# Patient Record
Sex: Female | Born: 1995 | Race: White | Hispanic: No | Marital: Single | Smoking: Never smoker
Health system: Southern US, Community
[De-identification: ages and names within clinical notes are randomized; demographics above are authoritative.]

---

## 2013-11-02 DIAGNOSIS — L03019 Cellulitis of unspecified finger: Secondary | ICD-10-CM | POA: Insufficient documentation

## 2013-11-03 ENCOUNTER — Encounter (HOSPITAL_COMMUNITY): Payer: Self-pay | Admitting: Emergency Medicine

## 2013-11-03 ENCOUNTER — Emergency Department (HOSPITAL_COMMUNITY)
Admission: EM | Admit: 2013-11-03 | Discharge: 2013-11-03 | Disposition: A | Discharge status: Home / Self Care | Payer: Self-pay | Attending: Emergency Medicine | Admitting: Emergency Medicine

## 2013-11-03 DIAGNOSIS — IMO0002 Reserved for concepts with insufficient information to code with codable children: Secondary | ICD-10-CM

## 2013-11-03 MED ORDER — CEPHALEXIN 500 MG PO CAPS: 500.0000 mg | ORAL_CAPSULE | Freq: Three times a day (TID) | ORAL | Status: DC

## 2013-11-03 MED ORDER — HYDROCODONE-ACETAMINOPHEN 5-325 MG PO TABS: 1.0000 | ORAL_TABLET | Freq: Four times a day (QID) | ORAL | Status: DC | PRN

## 2013-11-03 NOTE — ED Provider Notes (Signed)
CSN: [id number]     Arrival date & time 11/02/13  2353 History   None    Chief Complaint  Patient presents with  . Hand Pain   HPI Comments: Patient presents for three days of right middle finger pain on her dominant hand.  She states that she noticed it starting to swell and become more painful to pressure two days ago.  She has tried ibuprofen with little relief.  She denies any history of injury or wound to the hand.  She denies biting her finger nails.      Patient is a 18 y.o. female presenting with hand pain. The history is provided by the patient. No language interpreter was used.  Hand Pain This is a new problem. The current episode started in the past 7 days. The problem occurs constantly. The problem has been gradually worsening. Pertinent negatives include no anorexia, arthralgias, chest pain, chills, diaphoresis, fatigue, fever, headaches, joint swelling, myalgias, numbness, rash or vomiting.    History reviewed. No pertinent past medical history. History reviewed. No pertinent past surgical history. No family history on file. History  Substance Use Topics  . Smoking status: Never Smoker   . Smokeless tobacco: Not on file  . Alcohol Use: Not on file   OB History   Grav Para Term Preterm Abortions TAB SAB Ect Mult Living                 Review of Systems  Constitutional: Negative for fever, chills, diaphoresis and fatigue.  Respiratory: Negative for chest tightness and shortness of breath.   Cardiovascular: Negative for chest pain.  Gastrointestinal: Negative for vomiting and anorexia.  Musculoskeletal: Negative for arthralgias, joint swelling and myalgias.  Skin: Negative for color change, rash and wound.  Neurological: Negative for numbness and headaches.  All other systems reviewed and are negative.     Allergies  Review of patient's allergies indicates no known allergies.  Home Medications   Prior to Admission medications   Not on File   BP 145/78  Pulse  102  Temp(Src) 98.3 F (36.8 C) (Oral)  Resp 18  Ht 5\' 8"  (1.727 m)  Wt 160 lb (72.576 kg)  BMI 24.33 kg/m2  SpO2 98%  LMP 10/14/2013 Physical Exam  Nursing note and vitals reviewed. Constitutional: She is oriented to person, place, and time. She appears well-developed and well-nourished. No distress.  HENT:  Head: Normocephalic and atraumatic.  Eyes: Conjunctivae are normal. No scleral icterus.  Neck: Normal range of motion. Neck supple.  Cardiovascular: Normal rate, regular rhythm and intact distal pulses.  Exam reveals no gallop and no friction rub.   No murmur heard. Pulmonary/Chest: Effort normal and breath sounds normal. No respiratory distress. She has no wheezes. She has no rales. She exhibits no tenderness.  Musculoskeletal: Normal range of motion.  Noted edema to the right middle finger on the medial side of the cuticular nail fold and the pad of the finger.  Finger pad and medial cuticular fold tender to palpation with no noted induration, fluctuance, and erythema.  Full active ROM noted.  2+ right radial pulses.  Finger has sensation to light touch intact.    Neurological: She is alert and oriented to person, place, and time.  Skin: Skin is warm and dry. She is not diaphoretic.  Psychiatric: She has a normal mood and affect. Her behavior is normal. Judgment and thought content normal.    ED Course  INCISION AND DRAINAGE Date/Time: 11/03/2013 2:15 AM Performed by:  FORUCCI, Azriel Dancy A Authorized by: Madelyn FlavorsFORUCCI, Lexie Morini A Consent: Verbal consent obtained. Risks and benefits: risks, benefits and alternatives were discussed Consent given by: patient Patient understanding: patient states understanding of the procedure being performed Patient consent: the patient's understanding of the procedure matches consent given Procedure consent: procedure consent matches procedure scheduled Relevant documents: relevant documents present and verified Test results: test results available  and properly labeled Site marked: the operative site was marked Patient identity confirmed: verbally with patient Type: abscess Body area: upper extremity Anesthesia: digital block Local anesthetic: lidocaine 1% without epinephrine Anesthetic total: 4.5 ml Patient sedated: no Scalpel size: 10 Incision type: single straight Complexity: simple Drainage: purulent and  serosanguinous Drainage amount: scant Wound treatment: wound left open Packing material: none Patient tolerance: Patient tolerated the procedure well with no immediate complications.   (including critical care time) Labs Review Labs Reviewed - No data to display  Imaging Review No results found.   EKG Interpretation None      MDM   Final diagnoses:  Paronychia   Patient presents with early paronychia.  Have performed an I&D here today with some small purulent drainage.  Will place the patient on Keflex TID x7 days and will give patient 6 tablets of hydrocodone for pain as needed.  She is encouraged to use warm compresses as much as she can tolerate.  Patient was also given resource list and was told to either follow up with her new PCP from the list or to follow up here if she started to experience worsening of swelling, fever, or pain.  Patient understands and agrees with this plan.      Clydie Braunourtney A Forucci, PA-C 11/03/13 [phone]

## 2013-11-03 NOTE — Discharge Instructions (Signed)
Paronychia Paronychia is an inflammatory reaction involving the folds of the skin surrounding the fingernail. This is commonly caused by an infection in the skin around a nail. The most common cause of paronychia is frequent wetting of the hands (as seen with bartenders, food servers, nurses or others who wet their hands). This makes the skin around the fingernail susceptible to infection by bacteria (germs) or fungus. Other predisposing factors are:  Aggressive manicuring.  Nail biting.  Thumb sucking. The most common cause is a staphylococcal (a type of germ) infection, or a fungal (Candida) infection. When caused by a germ, it usually comes on suddenly with redness, swelling, pus and is often painful. It may get under the nail and form an abscess (collection of pus), or form an abscess around the nail. If the nail itself is infected with a fungus, the treatment is usually prolonged and may require oral medicine for up to one year. Your caregiver will determine the length of time treatment is required. The paronychia caused by bacteria (germs) may largely be avoided by not pulling on hangnails or picking at cuticles. When the infection occurs at the tips of the finger it is called felon. When the cause of paronychia is from the herpes simplex virus (HSV) it is called herpetic whitlow. TREATMENT  When an abscess is present treatment is often incision and drainage. This means that the abscess must be cut open so the pus can get out. When this is done, the following home care instructions should be followed. HOME CARE INSTRUCTIONS   It is important to keep the affected fingers very dry. Rubber or plastic gloves over cotton gloves should be used whenever the hand must be placed in water.  Keep wound clean, dry and dressed as suggested by your caregiver between warm soaks or warm compresses.  Soak in warm water for fifteen to twenty minutes three to four times per day for bacterial infections. Fungal  infections are very difficult to treat, so often require treatment for long periods of time.  For bacterial (germ) infections take antibiotics (medicine which kill germs) as directed and finish the prescription, even if the problem appears to be solved before the medicine is gone.  Only take over-the-counter or prescription medicines for pain, discomfort, or fever as directed by your caregiver. SEEK IMMEDIATE MEDICAL CARE IF:  You have redness, swelling, or increasing pain in the wound.  You notice pus coming from the wound.  You have a fever.  You notice a bad smell coming from the wound or dressing. Document Released: 11/12/2000 Document Revised: 08/11/2011 Document Reviewed: 07/14/2008 [facility] Patient Information 2014 [place], [place].   Emergency Department Resource Guide 1) Find a Doctor and Pay Out of Pocket Although you won't have to find out who is covered by your insurance plan, it is a good idea to ask around and get recommendations. You will then need to call the office and see if the doctor you have chosen will accept you as a new patient and what types of options they offer for patients who are self-pay. Some doctors offer discounts or will set up payment plans for their patients who do not have insurance, but you will need to ask so you aren't surprised when you get to your appointment.  2) Contact Your Local Health Department Not all health departments have doctors that can see patients for sick visits, but many do, so it is worth a call to see if yours does. If you don't know where your  local health department is, you can check in your phone book. The CDC also has a tool to help you locate your state's health department, and many state websites also have listings of all of their local health departments.  3) Find a Walk-in Clinic If your illness is not likely to be very severe or complicated, you may want to try a walk in clinic. These are popping up all over the country in  pharmacies, drugstores, and shopping centers. They're usually staffed by nurse practitioners or physician assistants that have been trained to treat common illnesses and complaints. They're usually fairly quick and inexpensive. However, if you have serious medical issues or chronic medical problems, these are probably not your best option.  No Primary Care Doctor: - Call Health Connect at  [phone] - they can help you locate a primary care doctor that  accepts your insurance, provides certain services, etc. - Physician Referral Service- [phone]  Chronic Pain Problems: Organization         Address  Phone   Notes  [facility]  [phone]) [phone] Patients need to be referred by their primary care doctor.   Medication Assistance: Organization         Address  Phone   Notes  [facility]ication [facility] [address] [place], [place] [address] [phone] --Must be a resident of [facility] [place] -- Must have NO insurance coverage whatsoever (no Medicaid/ Medicare, etc.) -- The pt. MUST have a primary care doctor that directs their care regularly and follows them in the community   Med[facility]ist  [phone]) [phone]   [employer]  [phone]    Agencies that provide inexpensive medical care: Organization         Address  Phone   Notes  [facility]  [phone]) [phone]   [facility]    [phone]   [facility] [address] [place], [place] [address] [phone]   [facility] [address], [place] [phone]) [phone]   [facility]anned Parenthood    [phone](336) [phone]   [facility]    [phone]   [facility]  [address], [place] Phone:  [phone], Fax:  [phone] Hours of Operation:  9 am - 6 pm, M-F.  Also accepts Medicaid/Medicare and self-pay.  [facility]  [address],  [place] Phone: [phone]) [phone], Fax: [phone]) [phone]. Hours of Operation:  8:30 am - 5:30 pm, M-F.  Also accepts Medicaid and self-pay.  [facility] [place] [address], [place] Phone: [phone]) [phone]   [facility] [address], [place], [place] [phone], Ext. [phone] Mondays & Thursdays: 7-9 AM.  First 15 patients are seen on a first come, first serve basis.    Medicaid-accepting [facility] Providers:  Organization         Address  Phone   Notes  [facility] [address], [place] [phone] Also accepts self-pay patients.  [facility] [address], [place]  [phone]) [phone]   [facility] [address], [place] [phone]) [phone]   [facility] [address] [place] [address], [place] [phone]) [phone]   Renaye RakersVeita Bland [address], [place]   [phone] Only accepts [place] Access [place] patients after  they have their name applied to their card.   Self-Pay (no insurance) in [facility]:  Organization         Address  Phone   Notes  Sickle Cell Patients, [facility] [address] [place], [place] [phone]   [facility] [address] [place], [place] [phone]   [facility] [place]  [address], [place] [phone]   [facility]/Dr. Osei-Bonsu  [address] [place] or [address], [place] [phone] Phone number for both [place] and [place] locations is the same.  [facility] [address] [place] [phone]   [facility] [address] [place] or [address] [phone]   [facility] [address], [place] [phone], phone; [phone], fax Sees patients 1st and 3rd Saturday of every month.  Must not  qualify for public or private insurance (i.e. Medicaid, Medicare, [place] Health Choice, Veterans' Benefits)  Household income should be no more than 200% of the poverty level The clinic cannot treat you if you are pregnant or think you are pregnant  Sexually transmitted diseases are not treated at the clinic.    Dental Care: Organization         Address  Phone  Notes  [facility] Department of [facility] [address] [place], [place] [phone] Accepts children up to age 24 who are enrolled in [place] or [place] Health Choice; pregnant women with a Medicaid card; and children who have applied for Medicaid or [place] Health Choice, but were declined, whose parents can pay a reduced fee at time of service.  [facility] Department of [facility]  [address], [place] [phone] Accepts children up to age 47 who are enrolled in [place] or [place] Health Choice; pregnant women with a Medicaid card; and children who have applied for Medicaid or  Health Choice, but were declined, whose parents can pay a reduced fee at time of service.  [facility]  [address] [place], [place] [phone] Patients are seen by appointment only. Walk-ins are not accepted. [facility] will see patients 83 years of age and older. Monday - Tuesday (8am-5pm) Most Wednesdays (8:30-5pm) $30 per visit, cash only  [facility] Adult [facility] Access PRO[facility]  [address], [facility] [phone] Patients are seen by appointment only. Walk-ins are not accepted. [facility] will see patients 30 years of age and older. One Wednesday Evening (Monthly: Volunteer Based).  $30 per visit, cash only  [employer]  [phone] for adults; Children under age 36, call Graduate Pediatric Dentistry at [phone]. Children aged 8-14, please call [phone] to request a pediatric application.  Dental services are provided  in all areas of dental care including fillings, crowns and bridges, complete and partial dentures, implants, gum treatment, root canals, and extractions. Preventive care is also provided. Treatment is provided to both adults and children. Patients are selected via a lottery and there is often a waiting list.   [facility] [address] [place]  [phone] [email or web address]   [facility] [address] [place], [place] [phone], Ext. [phone] Second and Fourth Thursday of each month, opens at 6:30 AM; Clinic ends at 9 AM.  Patients are  seen on a first-come first-served basis, and a limited number are seen during each clinic.   [facility]  [address] [place], [place] [phone]   Eligibility Requirements You must have lived in [place], [place], or [place] counties for at least the last three months.   You cannot be eligible for state or federal sponsored [place], including [employer], [place], or [employer].   You generally cannot be eligible for healthcare insurance through your employer.    How to apply: Eligibility screenings are held every Tuesday and Wednesday afternoon from 1:00 pm until 4:00 pm. You do not need an appointment for the interview!  [facility] [address], [place], [place] [phone]   [facility] Health Department  [phone]   [facility] Health Department  [phone]   [facility] Health Department  [phone]    Behavioral Health Resources in the Community: Intensive Outpatient Programs Organization         Address  Phone  Notes  [facility] [address], [place], [place] [phone]   [facility] Outpatient [address] [place], [place] [phone]   [facility] [address], [place], [place]  [phone]   [facility] [address]  [place], [place]  [phone] or [phone]   Substance Abuse Resources Organization         Address  Phone  Notes  [facility] Drug Services  [phone]   [facility]  [phone]   [facility] [place]  [phone]   Floydene Flock  [phone]   Residential & Outpatient Substance Abuse Program  [phone]   Psychological Services Organization         Address  Phone  Notes  [facility] Behavioral Health  [phone]   [facility]  [phone]   [facility] Health [address] [place] [phone] or [phone]    Mobile Crisis Teams Organization         Address  Phone  Notes  [facility], Mobile Crisis Care Unit  [phone]   [facility]  [address] [place], [place] [phone]   Doristine Locks [address] [place] [phone]    Self-Help/Support Groups Organization         Address  Phone             Notes  [facility] [place] - variety of support groups  336- I7437963 Call for more information  Narcotics Anonymous (NA), [facility] [address], [employer] [place]  2 meetings at this location   Statistician         Address  Phone  Notes  [facility] [address],    [place]  [phone]   [facility]  [address], [place] [address], [place], [place] [phone]   [facility] [address] [place], [place] [phone] Admissions: 8am-3pm M-F  [facility] [address],    [place], [place] [phone]   [facility] [address] [place], [place] [phone]   [facility] [address],  [place], [place] [phone]   [facility] - Intensive Outpatient [address], [place], [place] [phone]   [facility] ([facility] [address]  [place], [place] [phone] or  [phone]   [facility] ([facility]) [address],  [place], [place] [phone] Accepts Medicaid  °[facility] [address]  °[place] 1-800-659-3381 Substance Abuse/Addiction Treatment  ° °[place] Behavioral Health Resources °Organization         Address  Phone  Notes  °[facility]  ([phone]) [phone]   °Julie Brannon, PhD [address] [place], [place]   ([phone]) [phone] or (336) 951-0000   °[facility]   [address] °[place], [place] (336) 349-4454   °[facility] [address], [place], [place] ([phone]) [phone] Insurance/Medicaid/sponsorship through [facility]  °[facility] [address]                                    [place], [place] ([phone]) [phone] Therapy/tele-psych/case  °[facility] [address]  ° [place], [place] [phone]) [phone]    °Dr. Arfeen  ([phone]) [phone]   °[facility] [place]  [employer] [place] [facility] 1) [address], [place] °2) [address], [place] °3)  [address], [place] ([phone]) [phone] °(336) [phone] ° °(336) [phone]   °[place] Child Abuse Hotline (336) 342-1394 or (336) 342-3537 (After Hours)    ° ° ° ° °

## 2013-11-03 NOTE — ED Notes (Signed)
C/o R middle finger pain & some numbness & swelling. Swelling, heat, redness noted. Onset ~ 3d ago. Worse tonight. No treatment or meds PTA. Did try soaking in warm water today. No known injuey. Denies other sx.

## 2013-11-05 DIAGNOSIS — L03019 Cellulitis of unspecified finger: Secondary | ICD-10-CM | POA: Insufficient documentation

## 2013-11-05 DIAGNOSIS — Z792 Long term (current) use of antibiotics: Secondary | ICD-10-CM | POA: Insufficient documentation

## 2013-11-05 DIAGNOSIS — IMO0002 Reserved for concepts with insufficient information to code with codable children: Secondary | ICD-10-CM | POA: Insufficient documentation

## 2013-11-05 NOTE — ED Notes (Signed)
The pt has infection  Along the cuticle  Rt middle finger for 5 days.  She was seen here 3 days ago for the same  She bites her nails

## 2013-11-06 ENCOUNTER — Encounter (HOSPITAL_COMMUNITY): Payer: Self-pay | Admitting: Emergency Medicine

## 2013-11-06 ENCOUNTER — Emergency Department (HOSPITAL_COMMUNITY)
Admission: EM | Admit: 2013-11-06 | Discharge: 2013-11-06 | Disposition: A | Discharge status: Home / Self Care | Payer: Self-pay | Attending: Emergency Medicine | Admitting: Emergency Medicine

## 2013-11-06 DIAGNOSIS — IMO0002 Reserved for concepts with insufficient information to code with codable children: Secondary | ICD-10-CM

## 2013-11-06 DIAGNOSIS — IMO0001 Reserved for inherently not codable concepts without codable children: Secondary | ICD-10-CM

## 2013-11-06 MED ORDER — SULFAMETHOXAZOLE-TRIMETHOPRIM 800-160 MG PO TABS: 1.0000 | ORAL_TABLET | Freq: Two times a day (BID) | ORAL | Status: AC

## 2013-11-06 MED ORDER — OXYCODONE-ACETAMINOPHEN 5-325 MG PO TABS
2.0000 | ORAL_TABLET | Freq: Once | ORAL | Status: AC
  Administered 2013-11-06: 2 via ORAL
  Filled 2013-11-06: qty 2

## 2013-11-06 MED ORDER — SULFAMETHOXAZOLE-TMP DS 800-160 MG PO TABS
1.0000 | ORAL_TABLET | Freq: Once | ORAL | Status: AC
Start: 2013-11-06 — End: 2013-11-06
  Administered 2013-11-06: 1 via ORAL
  Filled 2013-11-06: qty 1

## 2013-11-06 NOTE — ED Provider Notes (Signed)
Medical screening examination/treatment/procedure(s) were performed by non-physician practitioner and as supervising physician I was immediately available for consultation/collaboration.    Vida Roller, MD 11/06/13 [phone]

## 2013-11-06 NOTE — ED Provider Notes (Signed)
CSN: [id number]     Arrival date & time 11/05/13  2345 History   First MD Initiated Contact with Patient 11/06/13 0209     Chief Complaint  Patient presents with  . fingernail infection      (Consider location/radiation/quality/duration/timing/severity/associated sxs/prior Treatment) HPI This patient is a right-hand-dominant woman who presents with complaints of pain and swelling of her right middle finger. She was seen here 3 days ago for same and treated with incision and drainage of paronychia. She was prescribed Keflex and reports compliance with medication. However, she says that swelling has now extended to the finger pad of the right middle finger.  The pain as aching, throbbing and severe. The patient has been taking hydrocodone as prescribed. Denies fever. Tetanus up to date. No history of traumatic injury to this digit.  History reviewed. No pertinent past medical history. History reviewed. No pertinent past surgical history. No family history on file. History  Substance Use Topics  . Smoking status: Never Smoker   . Smokeless tobacco: Not on file  . Alcohol Use: No   OB History   Grav Para Term Preterm Abortions TAB SAB Ect Mult Living                 Review of Systems Ten point review of symptoms performed and is negative with the exception of symptoms noted above.    Allergies  Review of patient's allergies indicates no known allergies.  Home Medications   Prior to Admission medications   Medication Sig Start Date End Date Taking? Authorizing Provider  cephALEXin (KEFLEX) 500 MG capsule Take 1 capsule (500 mg total) by mouth 3 (three) times daily. 11/03/13  Yes Courtney A Forucci, PA-C  HYDROcodone-acetaminophen (NORCO/VICODIN) 5-325 MG per tablet Take 1 tablet by mouth every 6 (six) hours as needed for moderate pain or severe pain. 11/03/13  Yes Courtney A Forucci, PA-C   BP 127/88  Pulse 78  Temp(Src) 98.4 F (36.9 C) (Oral)  Resp 18  Ht 5\' 8"  (1.727 m)  Wt  160 lb (72.576 kg)  BMI 24.33 kg/m2  SpO2 100%  LMP 10/14/2013 Physical Exam Gen: well developed and well nourished appearing Head: NCAT Eyes: PERL, EOMI Nose: no epistaixis or rhinorrhea Mouth/throat: mucosa is moist and pink Neck: normal to inspection Lungs: CTA B, no wheezing, rhonchi or rales CV: RRR, no murmur, extremities appear well perfused.  Abd: soft, notender, nondistended Back: normal to inspection Skin: warm and dry Ext: normal to inspection x for right middle finger - paronychia over the right lateral middle finger, felon right middle finger.  Neuro: CN ii-xii grossly intact, no focal deficits Psyche; normal affect,  calm and cooperative.  ED Course  Procedures (including critical care time) Labs Review  INCISION AND DRAINAGE Performed by: Brandt Loosen Consent: Verbal consent obtained. Risks and benefits: risks, benefits and alternatives were discussed Type: abscess  Body area:right middle finger  Anesthesia: digital block  Incision was made with a scalpel.  Local anesthetic: lidocaine 1%   Anesthetic total: 50ml  Complexity: simple, 11 blade used to dissect cuticle, approximately 2cc of blood and pus drained.   Patient tolerance: Patient tolerated the procedure well with no immediate complications.     MDM   Paronychia drained in ED. We will switch from Keflex to Bactrim to cover MRSA as source of infection. Patient counseled to follow up at [facility] for recheck in 2 days. Counseled re: return precautions.     Brandt Loosen, MD 11/06/13 [phone]

## 2013-11-06 NOTE — Discharge Instructions (Signed)
Paronychia Paronychia is an inflammatory reaction involving the folds of the skin surrounding the fingernail. This is commonly caused by an infection in the skin around a nail. The most common cause of paronychia is frequent wetting of the hands (as seen with bartenders, food servers, nurses or others who wet their hands). This makes the skin around the fingernail susceptible to infection by bacteria (germs) or fungus. Other predisposing factors are:  Aggressive manicuring.  Nail biting.  Thumb sucking. The most common cause is a staphylococcal (a type of germ) infection, or a fungal (Candida) infection. When caused by a germ, it usually comes on suddenly with redness, swelling, pus and is often painful. It may get under the nail and form an abscess (collection of pus), or form an abscess around the nail. If the nail itself is infected with a fungus, the treatment is usually prolonged and may require oral medicine for up to one year. Your caregiver will determine the length of time treatment is required. The paronychia caused by bacteria (germs) may largely be avoided by not pulling on hangnails or picking at cuticles. When the infection occurs at the tips of the finger it is called felon. When the cause of paronychia is from the herpes simplex virus (HSV) it is called herpetic whitlow. TREATMENT  When an abscess is present treatment is often incision and drainage. This means that the abscess must be cut open so the pus can get out. When this is done, the following home care instructions should be followed. HOME CARE INSTRUCTIONS   It is important to keep the affected fingers very dry. Rubber or plastic gloves over cotton gloves should be used whenever the hand must be placed in water.  Keep wound clean, dry and dressed as suggested by your caregiver between warm soaks or warm compresses.  Soak in warm water for fifteen to twenty minutes three to four times per day for bacterial infections. Fungal  infections are very difficult to treat, so often require treatment for long periods of time.  For bacterial (germ) infections take antibiotics (medicine which kill germs) as directed and finish the prescription, even if the problem appears to be solved before the medicine is gone.  Only take over-the-counter or prescription medicines for pain, discomfort, or fever as directed by your caregiver. SEEK IMMEDIATE MEDICAL CARE IF:  You have redness, swelling, or increasing pain in the wound.  You notice pus coming from the wound.  You have a fever.  You notice a bad smell coming from the wound or dressing. Document Released: 11/12/2000 Document Revised: 08/11/2011 Document Reviewed: 07/14/2008 [employer] Patient Information 2014 [employer].  

## 2013-11-06 NOTE — ED Notes (Signed)
Suture cart at bedside 

## 2014-08-27 ENCOUNTER — Encounter (HOSPITAL_COMMUNITY): Payer: Self-pay | Admitting: General Practice

## 2014-08-27 ENCOUNTER — Emergency Department (HOSPITAL_COMMUNITY): Payer: Self-pay

## 2014-08-27 ENCOUNTER — Emergency Department (HOSPITAL_COMMUNITY)
Admission: EM | Admit: 2014-08-27 | Discharge: 2014-08-27 | Disposition: A | Discharge status: Home / Self Care | Payer: Self-pay | Attending: Emergency Medicine | Admitting: Emergency Medicine

## 2014-08-27 ENCOUNTER — Emergency Department (HOSPITAL_COMMUNITY): Payer: No Typology Code available for payment source

## 2014-08-27 DIAGNOSIS — S299XXA Unspecified injury of thorax, initial encounter: Secondary | ICD-10-CM | POA: Insufficient documentation

## 2014-08-27 DIAGNOSIS — S3992XA Unspecified injury of lower back, initial encounter: Secondary | ICD-10-CM | POA: Insufficient documentation

## 2014-08-27 DIAGNOSIS — S0990XA Unspecified injury of head, initial encounter: Secondary | ICD-10-CM | POA: Insufficient documentation

## 2014-08-27 DIAGNOSIS — R55 Syncope and collapse: Secondary | ICD-10-CM | POA: Insufficient documentation

## 2014-08-27 DIAGNOSIS — R531 Weakness: Secondary | ICD-10-CM | POA: Insufficient documentation

## 2014-08-27 DIAGNOSIS — M546 Pain in thoracic spine: Secondary | ICD-10-CM

## 2014-08-27 DIAGNOSIS — M542 Cervicalgia: Secondary | ICD-10-CM

## 2014-08-27 DIAGNOSIS — Y92410 Unspecified street and highway as the place of occurrence of the external cause: Secondary | ICD-10-CM | POA: Insufficient documentation

## 2014-08-27 DIAGNOSIS — S199XXA Unspecified injury of neck, initial encounter: Secondary | ICD-10-CM | POA: Insufficient documentation

## 2014-08-27 DIAGNOSIS — R519 Headache, unspecified: Secondary | ICD-10-CM

## 2014-08-27 DIAGNOSIS — Y998 Other external cause status: Secondary | ICD-10-CM | POA: Insufficient documentation

## 2014-08-27 DIAGNOSIS — M545 Low back pain, unspecified: Secondary | ICD-10-CM

## 2014-08-27 DIAGNOSIS — Y9389 Activity, other specified: Secondary | ICD-10-CM | POA: Insufficient documentation

## 2014-08-27 DIAGNOSIS — R51 Headache: Secondary | ICD-10-CM

## 2014-08-27 DIAGNOSIS — Z792 Long term (current) use of antibiotics: Secondary | ICD-10-CM | POA: Insufficient documentation

## 2014-08-27 LAB — COMPREHENSIVE METABOLIC PANEL
ALK PHOS: 54 U/L (ref 39–117)
ALT: 11 U/L (ref 0–35)
ANION GAP: 9 (ref 5–15)
AST: 18 U/L (ref 0–37)
Albumin: 4.2 g/dL (ref 3.5–5.2)
BILIRUBIN TOTAL: 0.4 mg/dL (ref 0.3–1.2)
BUN: 13 mg/dL (ref 6–23)
CHLORIDE: 109 mmol/L (ref 96–112)
CO2: 21 mmol/L (ref 19–32)
Calcium: 9.1 mg/dL (ref 8.4–10.5)
Creatinine, Ser: 0.87 mg/dL (ref 0.50–1.10)
GFR calc non Af Amer: >90 mL/min (ref >90)
GLUCOSE: 92 mg/dL (ref 70–99)
POTASSIUM: 3.8 mmol/L (ref 3.5–5.1)
Sodium: 139 mmol/L (ref 135–145)
Total Protein: 6.9 g/dL (ref 6.0–8.3)

## 2014-08-27 LAB — CBC
HCT: 42.1 % (ref 36.0–46.0)
Hemoglobin: 14 g/dL (ref 12.0–15.0)
MCH: 28.7 pg (ref 26.0–34.0)
MCHC: 33.3 g/dL (ref 30.0–36.0)
MCV: 86.4 fL (ref 78.0–100.0)
PLATELETS: 233 10*3/uL (ref 150–400)
RBC: 4.87 MIL/uL (ref 3.87–5.11)
RDW: 12.9 % (ref 11.5–15.5)
WBC: 10.5 10*3/uL (ref 4.0–10.5)

## 2014-08-27 LAB — SAMPLE TO BLOOD BANK

## 2014-08-27 LAB — I-STAT BETA HCG BLOOD, ED (MC, WL, AP ONLY): I-stat hCG, quantitative: <5 m[IU]/mL (ref <5)

## 2014-08-27 MED ORDER — MORPHINE SULFATE 4 MG/ML IJ SOLN
4.0000 mg | Freq: Once | INTRAMUSCULAR | Status: AC
  Administered 2014-08-27: 4 mg via INTRAVENOUS
  Filled 2014-08-27: qty 1

## 2014-08-27 MED ORDER — IOHEXOL 300 MG/ML  SOLN
100.0000 mL | Freq: Once | INTRAMUSCULAR | Status: AC | PRN
  Administered 2014-08-27: 100 mL via INTRAVENOUS

## 2014-08-27 MED ORDER — NAPROXEN 500 MG PO TABS: 500.0000 mg | ORAL_TABLET | Freq: Two times a day (BID) | ORAL | Status: DC

## 2014-08-27 MED ORDER — METHOCARBAMOL 500 MG PO TABS: 500.0000 mg | ORAL_TABLET | Freq: Two times a day (BID) | ORAL | Status: DC | PRN
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
## 2014-08-27 NOTE — ED Notes (Signed)
Pt in CT.

## 2014-08-27 NOTE — ED Notes (Signed)
Pt transported to CT ?

## 2015-02-24 ENCOUNTER — Encounter (HOSPITAL_COMMUNITY): Payer: Self-pay | Admitting: *Deleted

## 2015-02-24 ENCOUNTER — Emergency Department (HOSPITAL_COMMUNITY)
Admission: EM | Admit: 2015-02-24 | Discharge: 2015-02-24 | Disposition: A | Discharge status: Home / Self Care | Payer: Self-pay | Attending: Emergency Medicine | Admitting: Emergency Medicine

## 2015-02-24 DIAGNOSIS — Z792 Long term (current) use of antibiotics: Secondary | ICD-10-CM | POA: Insufficient documentation

## 2015-02-24 DIAGNOSIS — L259 Unspecified contact dermatitis, unspecified cause: Secondary | ICD-10-CM | POA: Insufficient documentation

## 2015-02-24 DIAGNOSIS — Z791 Long term (current) use of non-steroidal anti-inflammatories (NSAID): Secondary | ICD-10-CM | POA: Insufficient documentation

## 2015-02-24 MED ORDER — DIPHENHYDRAMINE HCL 25 MG PO TABS: 25.0000 mg | ORAL_TABLET | Freq: Four times a day (QID) | ORAL | Status: DC | PRN

## 2015-02-24 MED ORDER — DIPHENHYDRAMINE HCL 25 MG PO CAPS
25.0000 mg | ORAL_CAPSULE | Freq: Once | ORAL | Status: AC
  Administered 2015-02-24: 25 mg via ORAL
  Filled 2015-02-24: qty 1

## 2015-02-24 MED ORDER — HYDROCORTISONE 2.5 % EX LOTN: TOPICAL_LOTION | Freq: Two times a day (BID) | CUTANEOUS | Status: DC

## 2015-02-24 NOTE — ED Notes (Signed)
Pt reports waking up with red rash and itching to arms. Has not taken any benadryl pta, airway intact.

## 2015-02-24 NOTE — ED Notes (Signed)
PT reports applying a frends body lotion to both arms on Friday. Pt woke up with a rash to both arms . Skin is red with rash to anterior and posterior arms . Pt reports itching to rash site.

## 2015-02-24 NOTE — ED Notes (Signed)
Declined W/C at D/C and was escorted to lobby by RN. 

## 2015-02-24 NOTE — ED Provider Notes (Signed)
CSN: [id number]     Arrival date & time 02/24/15  1222 History  This chart was scribed for non-physician practitioner, Will Marijo File, PA-C, working with Pricilla Loveless, MD, by Ronney Lion, ED Scribe. This patient was seen in room TR11C/TR11C and the patient's care was started at 1:20 PM.    Chief Complaint  Patient presents with  . Rash   The history is provided by the patient. No language interpreter was used.   HPI Comments: Helen Taylor is a 19 y.o. female who presents to the Emergency Department complaining of an itching rash to her bilateral arms that began this morning when she woke up, after trying her friend's body lotion on her arms yesterday for the first time. This is a new problem; patient states she had never tried this kind of lotion before. She states she has not yet washed off the lotion or showered since applying the lotion.  She reports it itches and does not hurt. She denies trouble swallowing, throat swelling, lip swelling, SOB, trouble breathing, abdominal pain, nausea, vomiting, or rash anywhere else on her body. She also denies any pain.     History reviewed. No pertinent past medical history. History reviewed. No pertinent past surgical history. History reviewed. No pertinent family history. Social History  Substance Use Topics  . Smoking status: Never Smoker   . Smokeless tobacco: None  . Alcohol Use: No   OB History    No data available     Review of Systems  Constitutional: Negative for fever.  HENT: Negative for facial swelling and trouble swallowing.   Respiratory: Negative for cough, chest tightness, shortness of breath and wheezing.   Gastrointestinal: Negative for nausea, vomiting and abdominal pain.  Musculoskeletal: Negative for myalgias and arthralgias.  Skin: Positive for rash.      Allergies  Review of patient's allergies indicates no known allergies.  Home Medications   Prior to Admission medications   Medication Sig Start Date End Date  Taking? Authorizing Provider  cephALEXin (KEFLEX) 500 MG capsule Take 1 capsule (500 mg total) by mouth 3 (three) times daily. 11/03/13   Courtney Forcucci, PA-C  diphenhydrAMINE (BENADRYL) 25 MG tablet Take 1 tablet (25 mg total) by mouth every 6 (six) hours as needed for itching (Rash). 02/24/15   Everlene Farrier, PA-C  HYDROcodone-acetaminophen (NORCO/VICODIN) 5-325 MG per tablet Take 1 tablet by mouth every 6 (six) hours as needed for moderate pain or severe pain. 11/03/13   Courtney Forcucci, PA-C  hydrocortisone 2.5 % lotion Apply topically 2 (two) times daily. 02/24/15   Everlene Farrier, PA-C  methocarbamol (ROBAXIN) 500 MG tablet Take 1 tablet (500 mg total) by mouth 2 (two) times daily as needed for muscle spasms. 08/27/14   Everlene Farrier, PA-C  naproxen (NAPROSYN) 500 MG tablet Take 1 tablet (500 mg total) by mouth 2 (two) times daily with a meal. 08/27/14   Everlene Farrier, PA-C   BP 126/74 mmHg  Pulse 73  Temp(Src) 97.9 F (36.6 C) (Oral)  Resp 18  SpO2 100%  LMP 02/01/2015 Physical Exam  Constitutional: She is oriented to person, place, and time. She appears well-developed and well-nourished. No distress.  Nontoxic appearing.  HENT:  Head: Normocephalic and atraumatic.  Mouth/Throat: Oropharynx is clear and moist. No oropharyngeal exudate.  Eyes: Conjunctivae are normal. Pupils are equal, round, and reactive to light. Right eye exhibits no discharge. Left eye exhibits no discharge.  Neck: Normal range of motion. Neck supple.  Cardiovascular: Normal rate, regular rhythm, normal heart  sounds and intact distal pulses.   Pulmonary/Chest: Effort normal and breath sounds normal. No respiratory distress. She has no wheezes. She has no rales.  Abdominal: Soft. There is no tenderness.  Musculoskeletal: She exhibits no edema or tenderness.  Lymphadenopathy:    She has no cervical adenopathy.  Neurological: She is alert and oriented to person, place, and time. Coordination normal.  Skin: Skin  is warm and dry. Rash noted. She is not diaphoretic. There is erythema. No pallor.  An erythematous macular rash is noted to her bilateral forearms. No vesicles, induration, discharge, or abscesses. Nontender to touch. No other rashes noted. No rashes noted her torso, back or legs.  Psychiatric: She has a normal mood and affect. Her behavior is normal.  Nursing note and vitals reviewed.   ED Course  Procedures (including critical care time)  DIAGNOSTIC STUDIES: Oxygen Saturation is 100% on RA, normal by my interpretation.    COORDINATION OF CARE: 1:21 PM - Discussed treatment plan with pt at bedside which includes washing the lotion from her arms and Rx hydrocortisone cream and Benadryl. Pt cautioned about sedating effects of Benadryl. Pt verbalized understanding and agreed to plan.   MDM   Meds given in ED:  Medications  diphenhydrAMINE (BENADRYL) capsule 25 mg (not administered)    New Prescriptions   DIPHENHYDRAMINE (BENADRYL) 25 MG TABLET    Take 1 tablet (25 mg total) by mouth every 6 (six) hours as needed for itching (Rash).   HYDROCORTISONE 2.5 % LOTION    Apply topically 2 (two) times daily.    Final diagnoses:  Contact dermatitis   This is a 19 year old female who presents the emergency department complaining of a rash to her bilateral forearms after she placed new lotion on her arms last night. Patient reports the rash is itchy and not painful. The patient has attempted nothing for treatment today. She still has not washed off the lotion on her arms. On exam the patient is afebrile nontoxic appearing. The patient has an erythematous macular rash noted to her bilateral arms. There are no vesicles, abscesses, drainage, or warmth noted. This is consistent with a contact dermatitis rash from the lotion. I advised her to wash off the lotion and will provide with Benadryl in the ED. We'll discharge with prescriptions for hydrocortisone lotion and Benadryl tablets. Strict return  precautions provided. I advised the patient to follow-up with their primary care provider this week. I advised the patient to return to the emergency department with new or worsening symptoms or new concerns. The patient verbalized understanding and agreement with plan.    I personally performed the services described in this documentation, which was scribed in my presence. The recorded information has been reviewed and is accurate.    Everlene Farrier, PA-C 02/24/15 1335  Pricilla Loveless, MD 02/27/15 [phone]

## 2015-02-24 NOTE — Discharge Instructions (Signed)
Contact Dermatitis °Contact dermatitis is a reaction to certain substances that touch the skin. Contact dermatitis can be either irritant contact dermatitis or allergic contact dermatitis. Irritant contact dermatitis does not require previous exposure to the substance for a reaction to occur. Allergic contact dermatitis only occurs if you have been exposed to the substance before. Upon a repeat exposure, your body reacts to the substance.  °CAUSES  °Many substances can cause contact dermatitis. Irritant dermatitis is most commonly caused by repeated exposure to mildly irritating substances, such as: °· Makeup. °· Soaps. °· Detergents. °· Bleaches. °· Acids. °· Metal salts, such as nickel. °Allergic contact dermatitis is most commonly caused by exposure to: °· Poisonous plants. °· Chemicals (deodorants, shampoos). °· Jewelry. °· Latex. °· Neomycin in triple antibiotic cream. °· Preservatives in products, including clothing. °SYMPTOMS  °The area of skin that is exposed may develop: °· Dryness or flaking. °· Redness. °· Cracks. °· Itching. °· Pain or a burning sensation. °· Blisters. °With allergic contact dermatitis, there may also be swelling in areas such as the eyelids, mouth, or genitals.  °DIAGNOSIS  °Your caregiver can usually tell what the problem is by doing a physical exam. In cases where the cause is uncertain and an allergic contact dermatitis is suspected, a patch skin test may be performed to help determine the cause of your dermatitis. °TREATMENT °Treatment includes protecting the skin from further contact with the irritating substance by avoiding that substance if possible. Barrier creams, powders, and gloves may be helpful. Your caregiver may also recommend: °· Steroid creams or ointments applied 2 times daily. For best results, soak the rash area in cool water for 20 minutes. Then apply the medicine. Cover the area with a plastic wrap. You can store the steroid cream in the refrigerator for a "chilly"  effect on your rash. That may decrease itching. Oral steroid medicines may be needed in more severe cases. °· Antibiotics or antibacterial ointments if a skin infection is present. °· Antihistamine lotion or an antihistamine taken by mouth to ease itching. °· Lubricants to keep moisture in your skin. °· Burow's solution to reduce redness and soreness or to dry a weeping rash. Mix one packet or tablet of solution in 2 cups cool water. Dip a clean washcloth in the mixture, wring it out a bit, and put it on the affected area. Leave the cloth in place for 30 minutes. Do this as often as possible throughout the day. °· Taking several cornstarch or baking soda baths daily if the area is too large to cover with a washcloth. °Harsh chemicals, such as alkalis or acids, can cause skin damage that is like a burn. You should flush your skin for 15 to 20 minutes with cold water after such an exposure. You should also seek immediate medical care after exposure. Bandages (dressings), antibiotics, and pain medicine may be needed for severely irritated skin.  °HOME CARE INSTRUCTIONS °· Avoid the substance that caused your reaction. °· Keep the area of skin that is affected away from hot water, soap, sunlight, chemicals, acidic substances, or anything else that would irritate your skin. °· Do not scratch the rash. Scratching may cause the rash to become infected. °· You may take cool baths to help stop the itching. °· Only take over-the-counter or prescription medicines as directed by your caregiver. °· See your caregiver for follow-up care as directed to make sure your skin is healing properly. °SEEK MEDICAL CARE IF:  °· Your condition is not better after 3   days of treatment. °· You seem to be getting worse. °· You see signs of infection such as swelling, tenderness, redness, soreness, or warmth in the affected area. °· You have any problems related to your medicines. °Document Released: 05/16/2000 Document Revised: 08/11/2011  Document Reviewed: 10/22/2010 °[employer]® Patient Information ©2015 [employer]. This information is not intended to replace advice given to you by your health care provider. Make sure you discuss any questions you have with your health care provider. ° °Rash °A rash is a change in the color or texture of your skin. There are many different types of rashes. You may have other problems that accompany your rash. °CAUSES  °· Infections. °· Allergic reactions. This can include allergies to pets or foods. °· Certain medicines. °· Exposure to certain chemicals, soaps, or cosmetics. °· Heat. °· Exposure to poisonous plants. °· Tumors, both cancerous and noncancerous. °SYMPTOMS  °· Redness. °· Scaly skin. °· Itchy skin. °· Dry or cracked skin. °· Bumps. °· Blisters. °· Pain. °DIAGNOSIS  °Your caregiver may do a physical exam to determine what type of rash you have. A skin sample (biopsy) may be taken and examined under a microscope. °TREATMENT  °Treatment depends on the type of rash you have. Your caregiver may prescribe certain medicines. For serious conditions, you may need to see a skin doctor (dermatologist). °HOME CARE INSTRUCTIONS  °· Avoid the substance that caused your rash. °· Do not scratch your rash. This can cause infection. °· You may take cool baths to help stop itching. °· Only take over-the-counter or prescription medicines as directed by your caregiver. °· Keep all follow-up appointments as directed by your caregiver. °SEEK IMMEDIATE MEDICAL CARE IF: °· You have increasing pain, swelling, or redness. °· You have a fever. °· You have new or severe symptoms. °· You have body aches, diarrhea, or vomiting. °· Your rash is not better after 3 days. °MAKE SURE YOU: °· Understand these instructions. °· Will watch your condition. °· Will get help right away if you are not doing well or get worse. °Document Released: 05/09/2002 Document Revised: 08/11/2011 Document Reviewed: 03/03/2011 °[employer]Care® Patient Information  ©2015 [employer]. This information is not intended to replace advice given to you by your health care provider. Make sure you discuss any questions you have with your health care provider. ° °

## 2016-01-11 ENCOUNTER — Encounter (HOSPITAL_COMMUNITY): Payer: Self-pay | Admitting: *Deleted

## 2016-01-11 ENCOUNTER — Emergency Department (HOSPITAL_COMMUNITY)
Admission: EM | Admit: 2016-01-11 | Discharge: 2016-01-11 | Disposition: A | Discharge status: Home / Self Care | Payer: Self-pay | Attending: Emergency Medicine | Admitting: Emergency Medicine

## 2016-01-11 DIAGNOSIS — J111 Influenza due to unidentified influenza virus with other respiratory manifestations: Secondary | ICD-10-CM | POA: Insufficient documentation

## 2016-01-11 DIAGNOSIS — R6889 Other general symptoms and signs: Secondary | ICD-10-CM

## 2016-01-11 DIAGNOSIS — R112 Nausea with vomiting, unspecified: Secondary | ICD-10-CM | POA: Insufficient documentation

## 2016-01-11 LAB — URINALYSIS, ROUTINE W REFLEX MICROSCOPIC
Glucose, UA: NEGATIVE mg/dL
HGB URINE DIPSTICK: NEGATIVE
Ketones, ur: 40 mg/dL — AB
Leukocytes, UA: NEGATIVE
Nitrite: NEGATIVE
Protein, ur: 30 mg/dL — AB
SPECIFIC GRAVITY, URINE: 1.028 (ref 1.005–1.030)
pH: 7.5 (ref 5.0–8.0)

## 2016-01-11 LAB — BASIC METABOLIC PANEL
Anion gap: 9 (ref 5–15)
BUN: 8 mg/dL (ref 6–20)
CALCIUM: 8.9 mg/dL (ref 8.9–10.3)
CO2: 24 mmol/L (ref 22–32)
CREATININE: 0.88 mg/dL (ref 0.44–1.00)
Chloride: 105 mmol/L (ref 101–111)
GFR calc non Af Amer: >60 mL/min (ref >60)
GLUCOSE: 91 mg/dL (ref 65–99)
Potassium: 3.8 mmol/L (ref 3.5–5.1)
Sodium: 138 mmol/L (ref 135–145)

## 2016-01-11 LAB — URINE MICROSCOPIC-ADD ON: RBC / HPF: NONE SEEN RBC/hpf (ref 0–5)

## 2016-01-11 LAB — POC URINE PREG, ED: PREG TEST UR: NEGATIVE

## 2016-01-11 MED ORDER — ONDANSETRON 4 MG PO TBDP
4.0000 mg | ORAL_TABLET | Freq: Once | ORAL | Status: AC | PRN
  Administered 2016-01-11: 4 mg via ORAL

## 2016-01-11 MED ORDER — ONDANSETRON 4 MG PO TBDP
ORAL_TABLET | ORAL | Status: AC
  Filled 2016-01-11: qty 1

## 2016-01-11 MED ORDER — ONDANSETRON HCL 4 MG PO TABS: 4.0000 mg | ORAL_TABLET | Freq: Four times a day (QID) | ORAL | 0 refills | Status: DC

## 2016-01-11 MED ORDER — ONDANSETRON HCL 4 MG/2ML IJ SOLN
4.0000 mg | Freq: Once | INTRAMUSCULAR | Status: AC
  Administered 2016-01-11: 4 mg via INTRAVENOUS
  Filled 2016-01-11: qty 2

## 2016-01-11 MED ORDER — SODIUM CHLORIDE 0.9 % IV SOLN
INTRAVENOUS | Status: DC
  Administered 2016-01-11: 19:00:00 via INTRAVENOUS

## 2016-01-11 NOTE — ED Triage Notes (Signed)
The pt is c/o body aches  n v headache from migraine headache no temp for 3-4 days  lmp last month

## 2016-01-11 NOTE — ED Notes (Signed)
Pt states she has N/V since last Tues after she ate undercooked chicken. Pt describes the emesis as yellow.

## 2016-01-11 NOTE — ED Provider Notes (Signed)
[facility]-EMERGENCY DEPT Provider Note   CSN: [id number] Arrival date & time: 01/11/16  1512  First Provider Contact:   First MD Initiated Contact with Patient 01/11/16 1848       By signing my name below, I, Placido SouLogan Joldersma, attest that this documentation has been prepared under the direction and in the presence of Kerrie BuffaloHope Sadiel Mota, NP. Electronically Signed: Placido SouLogan Joldersma, ED Scribe. 01/11/16. 7:01 PM.    History   Chief Complaint Chief Complaint  Patient presents with  . Generalized Body Aches    HPI HPI Comments: Helen Taylor is a 20 y.o. female who presents to the Emergency Department complaining of generalized body aches x 2 days. Pt states her symptoms began s/p eating undercooked chicken. She reports associated n/v, fatigue, sore throat, sinus congestion, intermittent fevers, mild LLQ pain, diffuse lower back pain, HA and decreased urinary frequency. She has vomited 6x today. Pt has not taken anything for her symptoms. Pt denies any known medical issues or taking any regular medications. She is not pregnant or breastfeeding. Pt has no known allergies. She denies diarrhea, ear pain, ear discharge, visual changes, palpitations, foot/hand swelling, cough, dysuria and increased urinary frequency.    The history is provided by the patient. No language interpreter was used.      History reviewed. No pertinent past medical history.  There are no active problems to display for this patient.   History reviewed. No pertinent surgical history.  OB History    No data available       Home Medications    Prior to Admission medications   Medication Sig Start Date End Date Taking? Authorizing Provider  cephALEXin (KEFLEX) 500 MG capsule Take 1 capsule (500 mg total) by mouth 3 (three) times daily. 11/03/13   Courtney Forcucci, PA-C  diphenhydrAMINE (BENADRYL) 25 MG tablet Take 1 tablet (25 mg total) by mouth every 6 (six) hours as needed for itching (Rash). 02/24/15   Everlene FarrierWilliam Dansie, PA-C    HYDROcodone-acetaminophen (NORCO/VICODIN) 5-325 MG per tablet Take 1 tablet by mouth every 6 (six) hours as needed for moderate pain or severe pain. 11/03/13   Courtney Forcucci, PA-C  hydrocortisone 2.5 % lotion Apply topically 2 (two) times daily. 02/24/15   Everlene FarrierWilliam Dansie, PA-C  methocarbamol (ROBAXIN) 500 MG tablet Take 1 tablet (500 mg total) by mouth 2 (two) times daily as needed for muscle spasms. 08/27/14   Everlene FarrierWilliam Dansie, PA-C  naproxen (NAPROSYN) 500 MG tablet Take 1 tablet (500 mg total) by mouth 2 (two) times daily with a meal. 08/27/14   Everlene FarrierWilliam Dansie, PA-C  ondansetron (ZOFRAN) 4 MG tablet Take 1 tablet (4 mg total) by mouth every 6 (six) hours. 01/11/16   Keelon Zurn Orlene OchM Alizandra Loh, NP    Family History No family history on file.  Social History Social History  Substance Use Topics  . Smoking status: Never Smoker  . Smokeless tobacco: Never Used  . Alcohol use No     Allergies   Review of patient's allergies indicates no known allergies.   Review of Systems Review of Systems  Constitutional: Positive for fatigue and fever.  HENT: Positive for congestion and sore throat. Negative for ear discharge and ear pain.   Respiratory: Negative for cough.   Cardiovascular: Negative for palpitations and leg swelling.  Gastrointestinal: Positive for nausea and vomiting. Negative for diarrhea.  Genitourinary: Positive for decreased urine volume. Negative for dysuria.  Musculoskeletal: Positive for back pain and myalgias. Negative for joint swelling.  All other systems reviewed and are  negative.  Physical Exam Updated Vital Signs BP 111/74 (BP Location: Right Arm)   Pulse 86   Temp 98.2 F (36.8 C) (Oral)   Resp 18   Wt 67.1 kg   LMP 12/11/2015   SpO2 99%   BMI 22.50 kg/m   Physical Exam  Constitutional: She is oriented to person, place, and time. She appears well-developed and well-nourished.  HENT:  Head: Normocephalic and atraumatic.  Right Ear: Hearing, tympanic membrane,  external ear and ear canal normal.  Left Ear: Hearing, tympanic membrane, external ear and ear canal normal.  Nose: Nose normal.  Mouth/Throat: Uvula is midline and mucous membranes are normal. Posterior oropharyngeal erythema (mild) present. No posterior oropharyngeal edema.  No tonsillar enlargement.  Eyes: EOM are normal. Pupils are equal, round, and reactive to light. No scleral icterus.  Neck: Normal range of motion.  Cardiovascular: Normal rate, regular rhythm and normal heart sounds.  Exam reveals no friction rub.   No murmur heard. 2+ DP and radial pulses bilaterally  Pulmonary/Chest: Effort normal and breath sounds normal. No respiratory distress. She has no wheezes. She has no rales.  Abdominal: Soft. Bowel sounds are normal. There is tenderness (mild tenderness to LLQ) in the left lower quadrant. There is no rebound, no guarding and no CVA tenderness.  No CVA tenderness  Minimal tenderness LLQ with deep palpation  Musculoskeletal: Normal range of motion.  Lymphadenopathy:    She has no cervical adenopathy.  Neurological: She is alert and oriented to person, place, and time.  Skin: Skin is warm and dry.  Psychiatric: She has a normal mood and affect. Her behavior is normal.  Nursing note and vitals reviewed.  ED Treatments / Results  Labs (all labs ordered are listed, but only abnormal results are displayed) Labs Reviewed  URINALYSIS, ROUTINE W REFLEX MICROSCOPIC (NOT AT [facility]) - Abnormal; Notable for the following:       Result Value   APPearance HAZY (*)    Bilirubin Urine SMALL (*)    Ketones, ur 40 (*)    Protein, ur 30 (*)    All other components within normal limits  URINE MICROSCOPIC-ADD ON - Abnormal; Notable for the following:    Squamous Epithelial / LPF 6-30 (*)    Bacteria, UA RARE (*)    All other components within normal limits  BASIC METABOLIC PANEL  POC URINE PREG, ED   Radiology No results found.  Procedures Procedures  DIAGNOSTIC  STUDIES: Oxygen Saturation is 99% on RA, normal by my interpretation.    COORDINATION OF CARE: 6:56 PM Discussed next steps with pt. Pt verbalized understanding and is agreeable with the plan.    Medications Ordered in ED Medications  0.9 %  sodium chloride infusion ( Intravenous New Bag/Given 01/11/16 1915)  ondansetron (ZOFRAN-ODT) disintegrating tablet 4 mg (4 mg Oral Given 01/11/16 1550)  ondansetron (ZOFRAN) injection 4 mg (4 mg Intravenous Given 01/11/16 1914)     Initial Impression / Assessment and Plan / ED Course  I have reviewed the triage vital signs and the nursing notes.  Pertinent labresults that were available during my care of the patient were reviewed by me and considered in my medical decision making (see chart for details).  Clinical Course    Patient with flu like symptoms.  Vitals are stable, no fever.  Mild dehydration. IV fluids administered with Zofran for nausea.  Lungs are clear. Patient states she feels much better and request to go home. Will d/c with zofran for use  as needed for nausea.    I personally performed the services described in this documentation, which was scribed in my presence. The recorded information has been reviewed and is accurate.   Final Clinical Impressions(s) / ED Diagnoses  20 y.o. female with n/v that resolved with treatment stable for d/c without fever and does not appear toxic.  Final diagnoses:  Nausea and vomiting, vomiting of unspecified type  Flu-like symptoms    New Prescriptions New Prescriptions   ONDANSETRON (ZOFRAN) 4 MG TABLET    Take 1 tablet (4 mg total) by mouth every 6 (six) hours.     [address] [place], [place] 01/12/16 7829    Marily Memos, MD 01/12/16 [id number]

## 2016-07-22 ENCOUNTER — Emergency Department (HOSPITAL_COMMUNITY)
Admission: EM | Admit: 2016-07-22 | Discharge: 2016-07-22 | Disposition: A | Discharge status: Home / Self Care | Payer: Self-pay | Attending: Emergency Medicine | Admitting: Emergency Medicine

## 2016-07-22 ENCOUNTER — Encounter (HOSPITAL_COMMUNITY): Payer: Self-pay

## 2016-07-22 DIAGNOSIS — J111 Influenza due to unidentified influenza virus with other respiratory manifestations: Secondary | ICD-10-CM | POA: Insufficient documentation

## 2016-07-22 LAB — URINALYSIS, ROUTINE W REFLEX MICROSCOPIC
BILIRUBIN URINE: NEGATIVE
GLUCOSE, UA: NEGATIVE mg/dL
KETONES UR: NEGATIVE mg/dL
LEUKOCYTES UA: NEGATIVE
Nitrite: NEGATIVE
PH: 5 (ref 5.0–8.0)
PROTEIN: NEGATIVE mg/dL
Specific Gravity, Urine: 1.018 (ref 1.005–1.030)

## 2016-07-22 LAB — COMPREHENSIVE METABOLIC PANEL
ALT: 10 U/L — ABNORMAL LOW (ref 14–54)
AST: 16 U/L (ref 15–41)
Albumin: 4.2 g/dL (ref 3.5–5.0)
Alkaline Phosphatase: 54 U/L (ref 38–126)
Anion gap: 8 (ref 5–15)
BUN: 9 mg/dL (ref 6–20)
CALCIUM: 8.9 mg/dL (ref 8.9–10.3)
CO2: 23 mmol/L (ref 22–32)
Chloride: 106 mmol/L (ref 101–111)
Creatinine, Ser: 0.85 mg/dL (ref 0.44–1.00)
GFR calc non Af Amer: >60 mL/min (ref >60)
Glucose, Bld: 98 mg/dL (ref 65–99)
Potassium: 3.7 mmol/L (ref 3.5–5.1)
SODIUM: 137 mmol/L (ref 135–145)
Total Bilirubin: 0.5 mg/dL (ref 0.3–1.2)
Total Protein: 6.9 g/dL (ref 6.5–8.1)

## 2016-07-22 LAB — I-STAT BETA HCG BLOOD, ED (MC, WL, AP ONLY): I-stat hCG, quantitative: <5 m[IU]/mL (ref <5)

## 2016-07-22 LAB — CBC
HCT: 40.3 % (ref 36.0–46.0)
Hemoglobin: 13.3 g/dL (ref 12.0–15.0)
MCH: 28.9 pg (ref 26.0–34.0)
MCHC: 33 g/dL (ref 30.0–36.0)
MCV: 87.4 fL (ref 78.0–100.0)
Platelets: 192 10*3/uL (ref 150–400)
RBC: 4.61 MIL/uL (ref 3.87–5.11)
RDW: 13 % (ref 11.5–15.5)
WBC: 7 10*3/uL (ref 4.0–10.5)

## 2016-07-22 LAB — LIPASE, BLOOD: LIPASE: 25 U/L (ref 11–51)

## 2016-07-22 MED ORDER — ONDANSETRON 4 MG PO TBDP
ORAL_TABLET | ORAL | Status: DC
Start: 2016-07-22 — End: 2016-07-22
  Filled 2016-07-22: qty 1

## 2016-07-22 MED ORDER — ONDANSETRON 4 MG PO TBDP
4.0000 mg | ORAL_TABLET | Freq: Three times a day (TID) | ORAL | 0 refills | Status: DC | PRN
Start: 2016-07-22 — End: 2022-09-29

## 2016-07-22 MED ORDER — CYCLOBENZAPRINE HCL 10 MG PO TABS: 10.0000 mg | ORAL_TABLET | Freq: Two times a day (BID) | ORAL | 0 refills | Status: DC | PRN

## 2016-07-22 MED ORDER — ACETAMINOPHEN 325 MG PO TABS
650.0000 mg | ORAL_TABLET | Freq: Once | ORAL | Status: AC
  Administered 2016-07-22: 650 mg via ORAL

## 2016-07-22 MED ORDER — ONDANSETRON 4 MG PO TBDP
4.0000 mg | ORAL_TABLET | Freq: Once | ORAL | Status: AC
  Administered 2016-07-22: 4 mg via ORAL

## 2016-07-22 MED ORDER — ACETAMINOPHEN 325 MG PO TABS
ORAL_TABLET | ORAL | Status: AC
  Filled 2016-07-22: qty 2

## 2016-07-22 NOTE — ED Notes (Signed)
ED Provider at bedside. 

## 2016-07-22 NOTE — ED Triage Notes (Signed)
Patient complains of 2 days of fever and chills with vomiting, complains of headache with same, NAD

## 2016-07-22 NOTE — ED Provider Notes (Signed)
[facility]PT Provider Note   CSN: [id number] Arrival date & time: 07/22/16  1405   By signing my name below, I, Soijett Blue, attest that this documentation has been prepared under the direction and in the presence of Alvira Monday, MD. Electronically Signed: Soijett Blue, ED Scribe. 07/22/16. 5:04 PM.  History   Chief Complaint Chief Complaint  Patient presents with  . fever, chills, vomiting    HPI Helen Taylor is a 21 y.o. female who presents to the Emergency Department complaining of fever onset 2 days ago. Pt reports associated chills, nausea, vomiting x 3 episodes today, 7/10 HA, generalized body aches, non-productive cough, nasal congestion, and intermittent dysuria x 1 month. Pt hasn't tried any medications for the relief of her symptoms. Pt reports that she didn't obtain her flu vaccination this past year. Pt notes that she has sick contacts. She denies sore throat, diarrhea, abdominal pain, vaginal discharge, neck stiffness, and any other symptoms.    The history is provided by the patient. No language interpreter was used.    History reviewed. No pertinent past medical history.  There are no active problems to display for this patient.   History reviewed. No pertinent surgical history.  OB History    No data available       Home Medications    Prior to Admission medications   Medication Sig Start Date End Date Taking? Authorizing Provider  cephALEXin (KEFLEX) 500 MG capsule Take 1 capsule (500 mg total) by mouth 3 (three) times daily. 11/03/13   Courtney Forcucci, PA-C  cyclobenzaprine (FLEXERIL) 10 MG tablet Take 1 tablet (10 mg total) by mouth 2 (two) times daily as needed for muscle spasms. 07/22/16   Alvira Monday, MD  diphenhydrAMINE (BENADRYL) 25 MG tablet Take 1 tablet (25 mg total) by mouth every 6 (six) hours as needed for itching (Rash). 02/24/15   Everlene Farrier, PA-C  HYDROcodone-acetaminophen (NORCO/VICODIN) 5-325 MG per tablet Take 1 tablet  by mouth every 6 (six) hours as needed for moderate pain or severe pain. 11/03/13   Courtney Forcucci, PA-C  hydrocortisone 2.5 % lotion Apply topically 2 (two) times daily. 02/24/15   Everlene Farrier, PA-C  methocarbamol (ROBAXIN) 500 MG tablet Take 1 tablet (500 mg total) by mouth 2 (two) times daily as needed for muscle spasms. 08/27/14   Everlene Farrier, PA-C  naproxen (NAPROSYN) 500 MG tablet Take 1 tablet (500 mg total) by mouth 2 (two) times daily with a meal. 08/27/14   Everlene Farrier, PA-C  ondansetron (ZOFRAN ODT) 4 MG disintegrating tablet Take 1 tablet (4 mg total) by mouth every 8 (eight) hours as needed for nausea or vomiting. 07/22/16   Alvira Monday, MD  ondansetron (ZOFRAN) 4 MG tablet Take 1 tablet (4 mg total) by mouth every 6 (six) hours. 01/11/16   Hope Orlene Och, NP    Family History No family history on file.  Social History Social History  Substance Use Topics  . Smoking status: Never Smoker  . Smokeless tobacco: Never Used  . Alcohol use No     Allergies   Patient has no known allergies.   Review of Systems Review of Systems  Constitutional: Positive for chills and fever.  HENT: Positive for congestion. Negative for sore throat.   Eyes: Negative for visual disturbance.  Respiratory: Positive for cough (non-productive). Negative for shortness of breath.   Cardiovascular: Negative for chest pain.  Gastrointestinal: Positive for nausea and vomiting. Negative for abdominal pain and diarrhea.  Genitourinary: Positive for dysuria.  Negative for difficulty urinating and vaginal discharge.  Musculoskeletal: Positive for myalgias. Negative for back pain, neck pain and neck stiffness.  Skin: Negative for rash.  Neurological: Positive for headaches. Negative for syncope.     Physical Exam Updated Vital Signs BP 116/57   Pulse 80   Temp 98.6 F (37 C) (Oral)   Resp 18   SpO2 99%   Physical Exam  Constitutional: She is oriented to person, place, and time. She  appears well-developed and well-nourished. No distress.  HENT:  Head: Normocephalic and atraumatic.  Right Ear: Tympanic membrane, external ear and ear canal normal.  Left Ear: Tympanic membrane, external ear and ear canal normal.  Mouth/Throat: Uvula is midline, oropharynx is clear and moist and mucous membranes are normal. No oropharyngeal exudate.  Eyes: Conjunctivae and EOM are normal. Pupils are equal, round, and reactive to light.  Neck: Neck supple.  No meningeal signs  Cardiovascular: Normal rate, regular rhythm and normal heart sounds.  Exam reveals no gallop and no friction rub.   No murmur heard. Pulmonary/Chest: Effort normal and breath sounds normal. No respiratory distress. She has no wheezes. She has no rales.  Abdominal: Soft. She exhibits no distension. There is no tenderness.  No CVA tenderness  Musculoskeletal: Normal range of motion.  Neurological: She is alert and oriented to person, place, and time.  Skin: Skin is warm and dry.  Psychiatric: She has a normal mood and affect. Her behavior is normal.  Nursing note and vitals reviewed.    ED Treatments / Results  DIAGNOSTIC STUDIES: Oxygen Saturation is 100% on RA, nl by my interpretation.    COORDINATION OF CARE: 5:04 PM Discussed treatment plan with pt at bedside which includes labs, UA, zofran Rx, and pt agreed to plan.   Labs (all labs ordered are listed, but only abnormal results are displayed) Labs Reviewed  COMPREHENSIVE METABOLIC PANEL - Abnormal; Notable for the following:       Result Value   ALT 10 (*)    All other components within normal limits  URINALYSIS, ROUTINE W REFLEX MICROSCOPIC - Abnormal; Notable for the following:    Hgb urine dipstick SMALL (*)    Bacteria, UA RARE (*)    Squamous Epithelial / LPF 0-5 (*)    All other components within normal limits  LIPASE, BLOOD  CBC  I-STAT BETA HCG BLOOD, ED ([facility], [facility], [facility] ONLY)    Procedures Procedures (including critical care  time)  Medications Ordered in ED Medications  acetaminophen (TYLENOL) tablet 650 mg (650 mg Oral Given 07/22/16 1418)  ondansetron (ZOFRAN-ODT) disintegrating tablet 4 mg (4 mg Oral Given 07/22/16 1418)     Initial Impression / Assessment and Plan / ED Course  I have reviewed the triage vital signs and the nursing notes.  Pertinent labs that were available during my care of the patient were reviewed by me and considered in my medical decision making (see chart for details).     [age]year old female presents with concern for cough, nasal congestion, body aches and fever.  Doubt pneumonia given normal breath sounds, no hypoxia, no tachypnea, flu like symptoms. No sign of otitis media. Doubt bacterial sinusitis given duration of symptoms. No sign of meningitis.  No sign of UTI. Patient with sick contact and suspect influenza. Offered tamiflu, however do not feel tamiflu is indicated given age and lack of co morbidities as well as duration of symptoms and recommend supportive care. Pt agrees with supportive care.  Recommend ibuprofen, tylenol  and gave rx for flexeril and zofran. Patient discharged in stable condition with understanding of reasons to return.    Final Clinical Impressions(s) / ED Diagnoses   Final diagnoses:  Influenza    New Prescriptions Discharge Medication List as of 07/22/2016  5:08 PM    START taking these medications   Details  cyclobenzaprine (FLEXERIL) 10 MG tablet Take 1 tablet (10 mg total) by mouth 2 (two) times daily as needed for muscle spasms., Starting Tue 07/22/2016, Print    ondansetron (ZOFRAN ODT) 4 MG disintegrating tablet Take 1 tablet (4 mg total) by mouth every 8 (eight) hours as needed for nausea or vomiting., Starting Tue 07/22/2016, Print       I personally performed the services described in this documentation, which was scribed in my presence. The recorded information has been reviewed and is accurate.     Alvira Monday, MD 07/23/16 [phone]

## 2016-10-15 ENCOUNTER — Encounter (HOSPITAL_COMMUNITY): Payer: Self-pay | Admitting: Emergency Medicine

## 2016-10-15 ENCOUNTER — Emergency Department (HOSPITAL_COMMUNITY)
Admission: EM | Admit: 2016-10-15 | Discharge: 2016-10-15 | Disposition: A | Discharge status: Home / Self Care | Payer: Self-pay | Attending: Emergency Medicine | Admitting: Emergency Medicine

## 2016-10-15 DIAGNOSIS — R5383 Other fatigue: Secondary | ICD-10-CM | POA: Insufficient documentation

## 2016-10-15 DIAGNOSIS — R110 Nausea: Secondary | ICD-10-CM

## 2016-10-15 DIAGNOSIS — R51 Headache: Secondary | ICD-10-CM | POA: Insufficient documentation

## 2016-10-15 DIAGNOSIS — R112 Nausea with vomiting, unspecified: Secondary | ICD-10-CM | POA: Insufficient documentation

## 2016-10-15 DIAGNOSIS — R519 Headache, unspecified: Secondary | ICD-10-CM

## 2016-10-15 MED ORDER — ACETAMINOPHEN 325 MG PO TABS
650.0000 mg | ORAL_TABLET | Freq: Once | ORAL | Status: AC
  Administered 2016-10-15: 650 mg via ORAL
  Filled 2016-10-15: qty 2

## 2016-10-15 MED ORDER — DEXAMETHASONE SODIUM PHOSPHATE 10 MG/ML IJ SOLN
10.0000 mg | Freq: Once | INTRAMUSCULAR | Status: AC
  Administered 2016-10-15: 10 mg via INTRAVENOUS
  Filled 2016-10-15: qty 1

## 2016-10-15 MED ORDER — DIPHENHYDRAMINE HCL 50 MG/ML IJ SOLN
25.0000 mg | Freq: Once | INTRAMUSCULAR | Status: AC
  Administered 2016-10-15: 25 mg via INTRAVENOUS
  Filled 2016-10-15: qty 1

## 2016-10-15 MED ORDER — SODIUM CHLORIDE 0.9 % IV BOLUS (SEPSIS)
1000.0000 mL | Freq: Once | INTRAVENOUS | Status: AC
  Administered 2016-10-15: 1000 mL via INTRAVENOUS

## 2016-10-15 MED ORDER — METOCLOPRAMIDE HCL 5 MG/ML IJ SOLN
10.0000 mg | Freq: Once | INTRAMUSCULAR | Status: AC
  Administered 2016-10-15: 10 mg via INTRAVENOUS
  Filled 2016-10-15: qty 2

## 2016-10-15 MED ORDER — ONDANSETRON 4 MG PO TBDP
4.0000 mg | ORAL_TABLET | Freq: Once | ORAL | Status: AC | PRN
  Administered 2016-10-15: 4 mg via ORAL
  Filled 2016-10-15: qty 1

## 2016-10-15 NOTE — ED Notes (Signed)
PT did not have to use RR at this time  

## 2016-10-15 NOTE — ED Notes (Signed)
ED Provider at bedside. 

## 2016-10-15 NOTE — ED Notes (Signed)
Pt vomiting at this time 

## 2016-10-15 NOTE — ED Notes (Signed)
Dansie, PA at bedside.  

## 2016-10-15 NOTE — ED Provider Notes (Signed)
[facility]-EMERGENCY DEPT Provider Note   CSN: [id number] Arrival date & time: 10/15/16  0545     History   Chief Complaint Chief Complaint  Patient presents with  . Migraine  . Nausea    HPI Helen Taylor is a 21 y.o. female.  Helen Taylor is a 21 y.o. Female who presents to the emergency department complaining of gradual onset of migraine headache since 11 PM yesterday. Patient reports her headache began gradually at 11 PM last night and has gradually worsened. She reports a generalized headache. She reports associated nausea and has had one episode of vomiting today. She reports feeling better after Zofran was provided in triage. She reports a history of headaches, but this feels slightly worse than her usual headaches. She denies any head injury or trauma. No treatments attempted prior to arrival to the emergency department. She denies fevers, neck pain, neck stiffness, numbness, tingling, weakness, headache injury, changes to her vision, abdominal pain, diarrhea or rashes.   The history is provided by the patient and medical records. No language interpreter was used.  Migraine  Associated symptoms include headaches. Pertinent negatives include no chest pain, no abdominal pain and no shortness of breath.    History reviewed. No pertinent past medical history.  There are no active problems to display for this patient.   History reviewed. No pertinent surgical history.  OB History    No data available       Home Medications    Prior to Admission medications   Medication Sig Start Date End Date Taking? Authorizing Provider  acetaminophen (TYLENOL) 500 MG tablet Take 1,000 mg by mouth every 6 (six) hours as needed for mild pain.   Yes [provider]  cyclobenzaprine (FLEXERIL) 10 MG tablet Take 1 tablet (10 mg total) by mouth 2 (two) times daily as needed for muscle spasms. 07/22/16  Yes Alvira Monday, MD  diphenhydrAMINE (BENADRYL) 25 MG tablet Take 1 tablet (25 mg  total) by mouth every 6 (six) hours as needed for itching (Rash). 02/24/15  Yes Everlene Farrier, PA-C  HYDROcodone-acetaminophen (NORCO/VICODIN) 5-325 MG per tablet Take 1 tablet by mouth every 6 (six) hours as needed for moderate pain or severe pain. 11/03/13  Yes Forcucci, Courtney, PA-C  methocarbamol (ROBAXIN) 500 MG tablet Take 1 tablet (500 mg total) by mouth 2 (two) times daily as needed for muscle spasms. 08/27/14  Yes Everlene Farrier, PA-C  ondansetron (ZOFRAN ODT) 4 MG disintegrating tablet Take 1 tablet (4 mg total) by mouth every 8 (eight) hours as needed for nausea or vomiting. 07/22/16  Yes Alvira Monday, MD    Family History History reviewed. No pertinent family history.  Social History Social History  Substance Use Topics  . Smoking status: Never Smoker  . Smokeless tobacco: Never Used  . Alcohol use No     Allergies   Patient has no known allergies.   Review of Systems Review of Systems  Constitutional: Positive for fatigue. Negative for chills and fever.  HENT: Negative for congestion, ear pain, hearing loss and sore throat.   Eyes: Negative for pain and visual disturbance.  Respiratory: Negative for cough and shortness of breath.   Cardiovascular: Negative for chest pain.  Gastrointestinal: Positive for nausea and vomiting. Negative for abdominal pain and diarrhea.  Genitourinary: Negative for dysuria.  Musculoskeletal: Negative for back pain and neck pain.  Skin: Negative for rash.  Neurological: Positive for headaches. Negative for dizziness, syncope, weakness, light-headedness and numbness.     Physical Exam  Updated Vital Signs BP (!) 119/47   Pulse (!) 58   Temp 98.1 F (36.7 C) (Oral)   Resp 17   LMP 09/15/2016   SpO2 98%   Physical Exam  Constitutional: She is oriented to person, place, and time. She appears well-developed and well-nourished. No distress.  Nontoxic appearing.  HENT:  Head: Normocephalic and atraumatic.  Right Ear: External  ear normal.  Left Ear: External ear normal.  Mouth/Throat: Oropharynx is clear and moist.  No temporal edema or tenderness. Bilateral tympanic membranes are pearly-gray without erythema or loss of landmarks.   Eyes: Conjunctivae and EOM are normal. Pupils are equal, round, and reactive to light. Right eye exhibits no discharge. Left eye exhibits no discharge.  Neck: Normal range of motion. Neck supple. No JVD present. No tracheal deviation present.  No meningeal signs.  Cardiovascular: Normal rate, regular rhythm, normal heart sounds and intact distal pulses.  Exam reveals no gallop and no friction rub.   No murmur heard. Pulmonary/Chest: Effort normal and breath sounds normal. No stridor. No respiratory distress. She has no wheezes. She has no rales.  Abdominal: Soft. There is no tenderness. There is no guarding.  Abdomen is soft and nontender to palpation.  Musculoskeletal: She exhibits no edema or tenderness.  Patient is spontaneously moving all extremities in a coordinated fashion exhibiting good strength.   Lymphadenopathy:    She has no cervical adenopathy.  Neurological: She is alert and oriented to person, place, and time. No cranial nerve deficit or sensory deficit. She exhibits normal muscle tone. Coordination normal.  Patient is alert and oriented 3. Cranial nerves are intact. Speech is clear and coherent. Finger to nose is intact. Normal gait. Sensation is intact in her bilateral upper and lower extremities. Good and equal grip strengths bilaterally.  Skin: Skin is warm and dry. Capillary refill takes less than 2 seconds. No rash noted. She is not diaphoretic. No erythema. No pallor.  Psychiatric: She has a normal mood and affect. Her behavior is normal.  Nursing note and vitals reviewed.    ED Treatments / Results  Labs (all labs ordered are listed, but only abnormal results are displayed) Labs Reviewed - No data to display  EKG  EKG Interpretation None        Radiology No results found.  Procedures Procedures (including critical care time)  Medications Ordered in ED Medications  ondansetron (ZOFRAN-ODT) disintegrating tablet 4 mg (4 mg Oral Given 10/15/16 0616)  sodium chloride 0.9 % bolus 1,000 mL (1,000 mLs Intravenous New Bag/Given 10/15/16 0728)  metoCLOPramide (REGLAN) injection 10 mg (10 mg Intravenous Given 10/15/16 0728)  diphenhydrAMINE (BENADRYL) injection 25 mg (25 mg Intravenous Given 10/15/16 0728)  dexamethasone (DECADRON) injection 10 mg (10 mg Intravenous Given 10/15/16 0728)  acetaminophen (TYLENOL) tablet 650 mg (650 mg Oral Given 10/15/16 1610)     Initial Impression / Assessment and Plan / ED Course  I have reviewed the triage vital signs and the nursing notes.  Pertinent labs & imaging results that were available during my care of the patient were reviewed by me and considered in my medical decision making (see chart for details).    This  is a 21 y.o. Female who presents to the emergency department complaining of gradual onset of migraine headache since 11 PM yesterday. Patient reports her headache began gradually at 11 PM last night and has gradually worsened. She reports a generalized headache. She reports associated nausea and has had one episode of vomiting today. She  reports feeling better after Zofran was provided in triage. She reports a history of headaches, but this feels slightly worse than her usual headaches. She denies any head injury or trauma.  On exam the patient is afebrile and non-toxic appearing. Her HA treated and resolved while in ED.  At reevaluation the patient reports her headache and nausea have resolved. She is tolerating PO prior to discharge. Presentation is like pts typical HA and non concerning for [facility], ICH, Meningitis, or temporal arteritis. Pt is afebrile with no focal neuro deficits, nuchal rigidity, or change in vision. Pt is to follow up with PCP to discuss prophylactic medication. I advised  the patient to follow-up with their primary care provider this week. I advised the patient to return to the emergency department with new or worsening symptoms or new concerns. The patient verbalized understanding and agreement with plan.    Final Clinical Impressions(s) / ED Diagnoses   Final diagnoses:  Bad headache  Nausea    New Prescriptions New Prescriptions   No medications on file     Everlene FarrierDansie, Rashidah [place], Cordelia Poche-C 10/15/16 1040    Horton, Mayer Maskerourtney F, MD 10/17/16 2258

## 2016-10-15 NOTE — ED Triage Notes (Signed)
Pt presents with migraine that began last night around 2300; pt also c/o photophobia and nausea without vomiting

## 2016-10-15 NOTE — ED Notes (Signed)
Pt did not need anything at this time  

## 2017-06-03 ENCOUNTER — Emergency Department (HOSPITAL_COMMUNITY): Payer: Self-pay

## 2017-06-03 ENCOUNTER — Other Ambulatory Visit: Payer: Self-pay

## 2017-06-03 ENCOUNTER — Emergency Department (HOSPITAL_COMMUNITY)
Admission: EM | Admit: 2017-06-03 | Discharge: 2017-06-03 | Disposition: A | Discharge status: Home / Self Care | Payer: Self-pay | Attending: Emergency Medicine | Admitting: Emergency Medicine

## 2017-06-03 ENCOUNTER — Encounter (HOSPITAL_COMMUNITY): Payer: Self-pay | Admitting: Emergency Medicine

## 2017-06-03 DIAGNOSIS — R059 Cough, unspecified: Secondary | ICD-10-CM

## 2017-06-03 DIAGNOSIS — R51 Headache: Secondary | ICD-10-CM | POA: Insufficient documentation

## 2017-06-03 DIAGNOSIS — R05 Cough: Secondary | ICD-10-CM | POA: Insufficient documentation

## 2017-06-03 DIAGNOSIS — J029 Acute pharyngitis, unspecified: Secondary | ICD-10-CM | POA: Insufficient documentation

## 2017-06-03 LAB — COMPREHENSIVE METABOLIC PANEL
ALBUMIN: 3.6 g/dL (ref 3.5–5.0)
ALK PHOS: 48 U/L (ref 38–126)
ALT: 15 U/L (ref 14–54)
AST: 22 U/L (ref 15–41)
Anion gap: 7 (ref 5–15)
BILIRUBIN TOTAL: 0.4 mg/dL (ref 0.3–1.2)
BUN: 9 mg/dL (ref 6–20)
CO2: 24 mmol/L (ref 22–32)
CREATININE: 0.77 mg/dL (ref 0.44–1.00)
Calcium: 8.6 mg/dL — ABNORMAL LOW (ref 8.9–10.3)
Chloride: 105 mmol/L (ref 101–111)
GFR calc Af Amer: >60 mL/min (ref >60)
GLUCOSE: 92 mg/dL (ref 65–99)
POTASSIUM: 4.3 mmol/L (ref 3.5–5.1)
Sodium: 136 mmol/L (ref 135–145)
TOTAL PROTEIN: 6.3 g/dL — AB (ref 6.5–8.1)

## 2017-06-03 LAB — CBC
HEMATOCRIT: 43.6 % (ref 36.0–46.0)
HEMOGLOBIN: 13.9 g/dL (ref 12.0–15.0)
MCH: 27.9 pg (ref 26.0–34.0)
MCHC: 31.9 g/dL (ref 30.0–36.0)
MCV: 87.6 fL (ref 78.0–100.0)
Platelets: 224 10*3/uL (ref 150–400)
RBC: 4.98 MIL/uL (ref 3.87–5.11)
RDW: 12.5 % (ref 11.5–15.5)
WBC: 7 10*3/uL (ref 4.0–10.5)

## 2017-06-03 LAB — LIPASE, BLOOD: Lipase: 26 U/L (ref 11–51)

## 2017-06-03 LAB — I-STAT BETA HCG BLOOD, ED (MC, WL, AP ONLY): I-stat hCG, quantitative: <5 m[IU]/mL (ref <5)

## 2017-06-03 MED ORDER — PENICILLIN G BENZATHINE 1200000 UNIT/2ML IM SUSP
1.2000 10*6.[IU] | Freq: Once | INTRAMUSCULAR | Status: AC
  Administered 2017-06-03: 1.2 10*6.[IU] via INTRAMUSCULAR
  Filled 2017-06-03: qty 2

## 2017-06-03 MED ORDER — KETOROLAC TROMETHAMINE 30 MG/ML IJ SOLN
30.0000 mg | Freq: Once | INTRAMUSCULAR | Status: AC
  Administered 2017-06-03: 30 mg via INTRAVENOUS
  Filled 2017-06-03: qty 1

## 2017-06-03 MED ORDER — DIPHENHYDRAMINE HCL 50 MG/ML IJ SOLN
12.5000 mg | Freq: Once | INTRAMUSCULAR | Status: AC
  Administered 2017-06-03: 12.5 mg via INTRAVENOUS
  Filled 2017-06-03: qty 1

## 2017-06-03 MED ORDER — SODIUM CHLORIDE 0.9 % IV BOLUS (SEPSIS)
1000.0000 mL | Freq: Once | INTRAVENOUS | Status: AC
  Administered 2017-06-03: 1000 mL via INTRAVENOUS

## 2017-06-03 MED ORDER — PROCHLORPERAZINE EDISYLATE 5 MG/ML IJ SOLN
10.0000 mg | Freq: Once | INTRAMUSCULAR | Status: AC
  Administered 2017-06-03: 10 mg via INTRAVENOUS
  Filled 2017-06-03: qty 2

## 2017-06-03 NOTE — ED Provider Notes (Signed)
[facility] EMERGENCY DEPARTMENT Provider Note   CSN: [id number] Arrival date & time: 06/03/17  1313     History   Chief Complaint Chief Complaint  Patient presents with  . Headache  . Emesis    HPI Helen Taylor is a 22 y.o. female.  The history is provided by the patient and medical records. No language interpreter was used.  Headache   Associated symptoms include nausea and vomiting. Pertinent negatives include no fever and no shortness of breath.  Emesis   Associated symptoms include cough and headaches. Pertinent negatives include no abdominal pain, no arthralgias, no chills, no diarrhea, no fever and no myalgias.   Helen Taylor is an otherwise healthy 22 y.o. female who presents to the Emergency Department complaining of productive cough, congestion, sore throat, nausea and headache x 4 days. Reports two episodes of emesis as well. She tried Tylenol with no improvement. No known sick contacts. No alleviating or aggravating factors. No known fevers.   History reviewed. No pertinent past medical history.  There are no active problems to display for this patient.   History reviewed. No pertinent surgical history.  OB History    No data available       Home Medications    Prior to Admission medications   Medication Sig Start Date End Date Taking? Authorizing Provider  acetaminophen (TYLENOL) 500 MG tablet Take 1,000 mg by mouth every 6 (six) hours as needed for mild pain.    [provider]  cyclobenzaprine (FLEXERIL) 10 MG tablet Take 1 tablet (10 mg total) by mouth 2 (two) times daily as needed for muscle spasms. 07/22/16   Alvira Monday, MD  diphenhydrAMINE (BENADRYL) 25 MG tablet Take 1 tablet (25 mg total) by mouth every 6 (six) hours as needed for itching (Rash). 02/24/15   Everlene Farrier, PA-C  HYDROcodone-acetaminophen (NORCO/VICODIN) 5-325 MG per tablet Take 1 tablet by mouth every 6 (six) hours as needed for moderate pain or  severe pain. 11/03/13   Forcucci, Courtney, PA-C  methocarbamol (ROBAXIN) 500 MG tablet Take 1 tablet (500 mg total) by mouth 2 (two) times daily as needed for muscle spasms. 08/27/14   Everlene Farrier, PA-C  ondansetron (ZOFRAN ODT) 4 MG disintegrating tablet Take 1 tablet (4 mg total) by mouth every 8 (eight) hours as needed for nausea or vomiting. 07/22/16   Alvira Monday, MD    Family History No family history on file.  Social History Social History   Tobacco Use  . Smoking status: Never Smoker  . Smokeless tobacco: Never Used  Substance Use Topics  . Alcohol use: No  . Drug use: No     Allergies   Patient has no known allergies.   Review of Systems Review of Systems  Constitutional: Negative for chills and fever.  HENT: Positive for congestion and sore throat.   Respiratory: Positive for cough. Negative for shortness of breath.   Cardiovascular: Negative for chest pain.  Gastrointestinal: Positive for nausea and vomiting. Negative for abdominal pain, blood in stool, constipation and diarrhea.  Musculoskeletal: Negative for arthralgias and myalgias.  Skin: Negative for rash.  Neurological: Positive for headaches.  All other systems reviewed and are negative.    Physical Exam Updated Vital Signs BP 127/71   Pulse 84   Temp 99.6 F (37.6 C) (Oral)   Resp 12   LMP 05/27/2017 (Approximate)   SpO2 98%   Physical Exam  Constitutional: She is oriented to person, place, and time. She appears well-developed  and well-nourished. No distress.  HENT:  Head: Normocephalic and atraumatic.  OP with erythema and tonsillar hypertrophy. No exudates.  Cardiovascular: Normal rate, regular rhythm and normal heart sounds.  No murmur heard. Pulmonary/Chest: Effort normal and breath sounds normal. No respiratory distress.  Lungs clear to auscultation bilaterally.  Abdominal: Soft. Bowel sounds are normal. She exhibits no distension.  No abdominal or CVA tenderness.    Musculoskeletal: She exhibits no edema.  Neurological: She is alert and oriented to person, place, and time.  Skin: Skin is warm and dry.  Nursing note and vitals reviewed.    ED Treatments / Results  Labs (all labs ordered are listed, but only abnormal results are displayed) Labs Reviewed  COMPREHENSIVE METABOLIC PANEL - Abnormal; Notable for the following components:      Result Value   Calcium 8.6 (*)    Total Protein 6.3 (*)    All other components within normal limits  LIPASE, BLOOD  CBC  URINALYSIS, ROUTINE W REFLEX MICROSCOPIC  I-STAT BETA HCG BLOOD, ED ([facility], [facility], [facility] ONLY)    EKG  EKG Interpretation None       Radiology Dg Chest 2 View  Result Date: 06/03/2017 CLINICAL DATA:  Productive cough for several days EXAM: CHEST  2 VIEW COMPARISON:  08/27/2014 FINDINGS: Cardiac shadow is within normal limits. The lungs are well aerated bilaterally. No focal infiltrate or sizable effusion is seen. No acute bony abnormality is noted. IMPRESSION: No active cardiopulmonary disease. Electronically Signed   By: Alcide CleverMark  Lukens M.D.   On: 06/03/2017 16:24    Procedures Procedures (including critical care time)  Medications Ordered in ED Medications  ketorolac (TORADOL) 30 MG/ML injection 30 mg (30 mg Intravenous Given 06/03/17 1548)    And  prochlorperazine (COMPAZINE) injection 10 mg (10 mg Intravenous Given 06/03/17 1547)    And  diphenhydrAMINE (BENADRYL) injection 12.5 mg (12.5 mg Intravenous Given 06/03/17 1546)    And  sodium chloride 0.9 % bolus 1,000 mL (0 mLs Intravenous Stopped 06/03/17 1645)  penicillin g benzathine (BICILLIN LA) 1200000 UNIT/2ML injection 1.2 Million Units (1.2 Million Units Intramuscular Given 06/03/17 1643)     Initial Impression / Assessment and Plan / ED Course  I have reviewed the triage vital signs and the nursing notes.  Pertinent labs & imaging results that were available during my care of the patient were reviewed by me and considered in my  medical decision making (see chart for details).    Helen Taylor is a 22 y.o. female who presents to ED for headache, cough, congestion, sore throat, nausea and two episodes of emesis over the last 4 days. Temp 99.6 with normal HR, BP and respirations. Lungs CTA and CXR negative. Labs reviewed and reassuring. Benign abdominal exam. OP with erythema and tonsillar hypertrophy. Rapid strep collected, but for some reason never made it to the lab. Patient feels much improved after migraine cocktail and requesting discharge to home. Explained that we are waiting for strep test, however she would like to leave regardless. Given Low grade temp, sore throat with erythema and tonsillar hypertrophy with cervical adenopathy and nausea, will go ahead and treat with bicillin. PCP follow up strongly encouraged. All questions answered.    Final Clinical Impressions(s) / ED Diagnoses   Final diagnoses:  Cough  Pharyngitis, unspecified etiology    ED Discharge Orders    None       Azelie Noguera, Chase PicketJaime Pilcher, PA-C 06/03/17 1946    Linwood DibblesKnapp, Jon, MD 06/04/17 2309

## 2017-06-03 NOTE — ED Notes (Signed)
Patient transported to X-ray 

## 2017-06-03 NOTE — ED Notes (Signed)
Sore throat body aches and h/a vomiting and nausea x 3 days

## 2017-06-03 NOTE — Discharge Instructions (Signed)
It was my pleasure taking care of you today!   Mucinex for nasal congestion. Alternate between Tylenol and ibuprofen as needed for body aches / fevers.   Rest, drink plenty of fluids to be sure you are staying hydrated.   Please follow up with your primary doctor for discussion of your diagnoses and further evaluation after today's visit if symptoms persist longer than 7 days; Return to the ER for high fevers, difficulty breathing or other concerning symptoms

## 2017-06-03 NOTE — ED Triage Notes (Signed)
Pt reports headache, sore throat and n/v since Saturday.

## 2019-09-16 ENCOUNTER — Other Ambulatory Visit: Payer: Self-pay

## 2019-09-16 DIAGNOSIS — N644 Mastodynia: Secondary | ICD-10-CM

## 2019-09-16 DIAGNOSIS — N6310 Unspecified lump in the right breast, unspecified quadrant: Secondary | ICD-10-CM

## 2019-09-22 ENCOUNTER — Ambulatory Visit: Payer: Self-pay

## 2019-10-20 ENCOUNTER — Ambulatory Visit: Payer: Self-pay | Admitting: *Deleted

## 2019-10-20 ENCOUNTER — Ambulatory Visit
Admission: RE | Admit: 2019-10-20 | Discharge: 2019-10-20 | Disposition: A | Discharge status: Home / Self Care | Payer: No Typology Code available for payment source | Source: Ambulatory Visit | Attending: Obstetrics and Gynecology | Admitting: Obstetrics and Gynecology

## 2019-10-20 ENCOUNTER — Other Ambulatory Visit: Payer: Self-pay | Admitting: Obstetrics and Gynecology

## 2019-10-20 ENCOUNTER — Other Ambulatory Visit: Payer: Self-pay

## 2019-10-20 VITALS — BP 120/78 | Temp 97.3°F | Wt 164.0 lb

## 2019-10-20 DIAGNOSIS — Z124 Encounter for screening for malignant neoplasm of cervix: Secondary | ICD-10-CM

## 2019-10-20 DIAGNOSIS — N6310 Unspecified lump in the right breast, unspecified quadrant: Secondary | ICD-10-CM

## 2019-10-20 DIAGNOSIS — Z01419 Encounter for gynecological examination (general) (routine) without abnormal findings: Secondary | ICD-10-CM

## 2019-10-20 DIAGNOSIS — N644 Mastodynia: Secondary | ICD-10-CM

## 2019-10-20 NOTE — Patient Instructions (Addendum)
Explained breast self awareness with Richardson Dopp. Let patient know [facility] will cover Pap smears every 3 years unless has a history of abnormal Pap smears. Referred patient to the [facility] for a right breast ultrasound. Appointment scheduled for Thursday, Oct 20, 2019 at 1010. Let patient know will follow up with her within the next couple weeks with results of her Pap smear and wet prep by phone. Discussed smoking cessation with patient. Referred to [place] quit line and gave resources to the free smoking cessation classes at [facility]. Devina Klimas verbalized understanding.  Wilena Tyndall, Kathaleen Maser, RN 10:35 AM

## 2019-10-20 NOTE — Progress Notes (Addendum)
Helen Taylor is a 24 y.o. No obstetric history on file. female who presents to [facility] clinic today with complaints of a right breast lump since October/November 2020, right breast spontaneous nipple discharge 2 months ago, and right breast pain when touches. Patient states the pain comes and goes. Patient rates the pain at a 3 out of 10.   Pap Smear: Pap smear completed today. Per patient has never had a Pap smear completed. No Pap smear results are in Epic.   Physical exam: Breasts Breasts symmetrical. No skin abnormalities bilateral breasts. No nipple retraction bilateral breasts. No nipple discharge bilateral breasts. Unable to express any nipple discharge on exam. No lymphadenopathy. No lumps palpated left breast. Palpated two bb sized lumps within the right breast at 2 o'clock 4 cm from the nipple. Patient complained of tenderness when palpated the lumps within the right breast.     Pelvic/Bimanual Ext Genitalia No lesions, no swelling and no discharge observed on external genitalia.        Vagina Vagina pink and normal texture. No lesions and milky white discharge observed in vagina. Wet prep completed.       Cervix Cervix is present. Cervix pink and of normal texture. Milky white discharge observed on cervix.   Uterus Uterus is present and palpable. Uterus in normal position and normal size.        Adnexae Bilateral ovaries present and palpable. No tenderness on palpation.         Rectovaginal No rectal exam completed today since patient had no rectal complaints. No skin abnormalities observed on exam.     Smoking History: Patient is a current smoker. Discussed smoking cessation with patient. Referred to [place] quit line and gave resources to the free smoking cessation classes at [facility].    Patient Navigation: Patient education provided. Access to services provided for patient through [place] program. Spanish interpreter Natale Lay from [facility] provided.   Breast and  Cervical Cancer Risk Assessment: Patient has no family history of breast cancer, known genetic mutations, or radiation treatment to the chest before age 4. Patient has no history of cervical dysplasia, immunocompromised, or DES exposure in-utero. Breast cancer risk assessment completed. No breast cancer risk calculated due to patient is less than 42 years old.  A: [facility] exam with pap smear  P: Referred patient to the [facility] for a right breast ultrasound. Appointment scheduled for Thursday, Oct 20, 2019 at 1010.  Priscille Heidelberg, RN 10/20/2019 10:34 AM

## 2019-10-21 LAB — CERVICOVAGINAL ANCILLARY ONLY
Bacterial Vaginitis (gardnerella): NEGATIVE
Candida Glabrata: NEGATIVE
Candida Vaginitis: NEGATIVE
Comment: NEGATIVE
Comment: NEGATIVE
Comment: NEGATIVE
Comment: NEGATIVE
Trichomonas: NEGATIVE

## 2019-10-21 LAB — CYTOLOGY - PAP
Comment: NEGATIVE
Diagnosis: NEGATIVE
High risk HPV: NEGATIVE

## 2019-10-24 ENCOUNTER — Telehealth: Payer: Self-pay

## 2019-10-24 ENCOUNTER — Ambulatory Visit
Admission: RE | Admit: 2019-10-24 | Discharge: 2019-10-24 | Disposition: A | Discharge status: Home / Self Care | Payer: No Typology Code available for payment source | Source: Ambulatory Visit | Attending: Obstetrics and Gynecology | Admitting: Obstetrics and Gynecology

## 2019-10-24 ENCOUNTER — Other Ambulatory Visit: Payer: Self-pay | Admitting: Obstetrics and Gynecology

## 2019-10-24 ENCOUNTER — Other Ambulatory Visit: Payer: Self-pay

## 2019-10-24 DIAGNOSIS — N6310 Unspecified lump in the right breast, unspecified quadrant: Secondary | ICD-10-CM

## 2019-10-24 NOTE — Telephone Encounter (Signed)
Normal Pap/HPV letter mailed.  °

## 2020-10-18 ENCOUNTER — Emergency Department (HOSPITAL_COMMUNITY): Payer: No Typology Code available for payment source

## 2020-10-18 ENCOUNTER — Emergency Department (HOSPITAL_COMMUNITY)
Admission: EM | Admit: 2020-10-18 | Discharge: 2020-10-18 | Disposition: A | Discharge status: Home / Self Care | Payer: No Typology Code available for payment source | Attending: Emergency Medicine | Admitting: Emergency Medicine

## 2020-10-18 ENCOUNTER — Encounter (HOSPITAL_COMMUNITY): Payer: Self-pay

## 2020-10-18 DIAGNOSIS — R1032 Left lower quadrant pain: Secondary | ICD-10-CM | POA: Diagnosis not present

## 2020-10-18 DIAGNOSIS — Z3A00 Weeks of gestation of pregnancy not specified: Secondary | ICD-10-CM | POA: Diagnosis not present

## 2020-10-18 DIAGNOSIS — O26891 Other specified pregnancy related conditions, first trimester: Secondary | ICD-10-CM | POA: Insufficient documentation

## 2020-10-18 DIAGNOSIS — Z3491 Encounter for supervision of normal pregnancy, unspecified, first trimester: Secondary | ICD-10-CM

## 2020-10-18 LAB — CBC WITH DIFFERENTIAL/PLATELET
Abs Immature Granulocytes: 0.02 10*3/uL (ref 0.00–0.07)
Basophils Absolute: 0 10*3/uL (ref 0.0–0.1)
Basophils Relative: 0 %
Eosinophils Absolute: 0.1 10*3/uL (ref 0.0–0.5)
Eosinophils Relative: 2 %
HCT: 43.7 % (ref 36.0–46.0)
Hemoglobin: 14.1 g/dL (ref 12.0–15.0)
Immature Granulocytes: 0 %
Lymphocytes Relative: 43 %
Lymphs Abs: 3.9 10*3/uL (ref 0.7–4.0)
MCH: 29.6 pg (ref 26.0–34.0)
MCHC: 32.3 g/dL (ref 30.0–36.0)
MCV: 91.8 fL (ref 80.0–100.0)
Monocytes Absolute: 0.5 10*3/uL (ref 0.1–1.0)
Monocytes Relative: 6 %
Neutro Abs: 4.6 10*3/uL (ref 1.7–7.7)
Neutrophils Relative %: 49 %
Platelets: 253 10*3/uL (ref 150–400)
RBC: 4.76 MIL/uL (ref 3.87–5.11)
RDW: 12.3 % (ref 11.5–15.5)
WBC: 9.2 10*3/uL (ref 4.0–10.5)
nRBC: 0 % (ref 0.0–0.2)

## 2020-10-18 LAB — COMPREHENSIVE METABOLIC PANEL
ALT: 14 U/L (ref 0–44)
AST: 17 U/L (ref 15–41)
Albumin: 4.2 g/dL (ref 3.5–5.0)
Alkaline Phosphatase: 47 U/L (ref 38–126)
Anion gap: 7 (ref 5–15)
BUN: 11 mg/dL (ref 6–20)
CO2: 22 mmol/L (ref 22–32)
Calcium: 9 mg/dL (ref 8.9–10.3)
Chloride: 107 mmol/L (ref 98–111)
Creatinine, Ser: 0.95 mg/dL (ref 0.44–1.00)
GFR, Estimated: >60 mL/min (ref >60)
Glucose, Bld: 101 mg/dL — ABNORMAL HIGH (ref 70–99)
Potassium: 3.7 mmol/L (ref 3.5–5.1)
Sodium: 136 mmol/L (ref 135–145)
Total Bilirubin: 0.8 mg/dL (ref 0.3–1.2)
Total Protein: 6.9 g/dL (ref 6.5–8.1)

## 2020-10-18 LAB — HCG, QUANTITATIVE, PREGNANCY: hCG, Beta Chain, Quant, S: 104 m[IU]/mL — ABNORMAL HIGH (ref <5)

## 2020-10-18 LAB — WET PREP, GENITAL
Clue Cells Wet Prep HPF POC: NONE SEEN
Sperm: NONE SEEN
Trich, Wet Prep: NONE SEEN
WBC, Wet Prep HPF POC: NONE SEEN
Yeast Wet Prep HPF POC: NONE SEEN

## 2020-10-18 MED ORDER — MORPHINE SULFATE (PF) 4 MG/ML IV SOLN
4.0000 mg | Freq: Once | INTRAVENOUS | Status: AC
  Administered 2020-10-18: 4 mg via INTRAVENOUS
  Filled 2020-10-18: qty 1

## 2020-10-18 NOTE — ED Provider Notes (Signed)
[facility] EMERGENCY DEPARTMENT Provider Note   CSN: [id number] Arrival date & time: 10/18/20  0719     History No chief complaint on file.   Destin Mote is a 25 y.o. female.  HPI 25 year old female with no pertinent medical history presents emergency department for abdominal pain.  This is in the left lower quadrant, is sharp and severe.  Started last night and has been constant.  Nothing makes it better, nothing makes it worse.  States she has had something similar, but less intense.  States that the previous pain was worse with bending and moving.  Reports that she took 3 extra strength Tylenol yesterday without improvement.  Denies nausea or vomiting.  Denies diarrhea.  Denies dysuria, but does state that she has a very small amount of blood in her urine.  Denies history of previous hematuria.  Denies fevers, vaginal discharge.    History reviewed. No pertinent past medical history.  There are no problems to display for this patient.   History reviewed. No pertinent surgical history.   OB History   No obstetric history on file.     History reviewed. No pertinent family history.  Social History   Tobacco Use  . Smoking status: Never Smoker  . Smokeless tobacco: Never Used  Vaping Use  . Vaping Use: Never used  Substance Use Topics  . Alcohol use: No    Comment: Occasionally  . Drug use: Yes    Types: Marijuana    Comment: occasionally    Home Medications Prior to Admission medications   Medication Sig Start Date End Date Taking? Authorizing Provider  acetaminophen (TYLENOL) 500 MG tablet Take 1,000 mg by mouth every 6 (six) hours as needed for mild pain.    [provider]  cyclobenzaprine (FLEXERIL) 10 MG tablet Take 1 tablet (10 mg total) by mouth 2 (two) times daily as needed for muscle spasms. 07/22/16   Alvira Monday, MD  diphenhydrAMINE (BENADRYL) 25 MG tablet Take 1 tablet (25 mg total) by mouth every 6 (six) hours as needed  for itching (Rash). 02/24/15   Everlene Farrier, PA-C  HYDROcodone-acetaminophen (NORCO/VICODIN) 5-325 MG per tablet Take 1 tablet by mouth every 6 (six) hours as needed for moderate pain or severe pain. 11/03/13   Shirleen Schirmer, PA-C  methocarbamol (ROBAXIN) 500 MG tablet Take 1 tablet (500 mg total) by mouth 2 (two) times daily as needed for muscle spasms. 08/27/14   Everlene Farrier, PA-C  ondansetron (ZOFRAN ODT) 4 MG disintegrating tablet Take 1 tablet (4 mg total) by mouth every 8 (eight) hours as needed for nausea or vomiting. 07/22/16   Alvira Monday, MD    Allergies    Patient has no known allergies.  Review of Systems   Review of Systems  Constitutional: Negative for chills and fever.  HENT: Negative for ear pain and sore throat.   Eyes: Negative for pain and visual disturbance.  Respiratory: Negative for cough and shortness of breath.   Cardiovascular: Negative for chest pain and palpitations.  Gastrointestinal: Positive for abdominal pain. Negative for vomiting.  Genitourinary: Positive for hematuria. Negative for dysuria.  Musculoskeletal: Negative for arthralgias and back pain.  Skin: Negative for color change and rash.  Neurological: Negative for seizures and syncope.  Psychiatric/Behavioral: Negative for sleep disturbance.  All other systems reviewed and are negative.   Physical Exam Updated Vital Signs BP 131/87   Pulse 78   Temp 98 F (36.7 C) (Oral)   Resp 17  Ht 5\' 8"  (1.727 m)   Wt 73.9 kg   SpO2 100%   BMI 24.78 kg/m   Physical Exam Vitals and nursing note reviewed. Exam conducted with a chaperone present.  Constitutional:      Appearance: She is well-developed.     Comments: Crying  HENT:     Head: Normocephalic and atraumatic.     Right Ear: External ear normal.     Left Ear: External ear normal.     Nose: Nose normal.     Mouth/Throat:     Mouth: Mucous membranes are moist.  Eyes:     Extraocular Movements: Extraocular movements intact.      Conjunctiva/sclera: Conjunctivae normal.  Cardiovascular:     Rate and Rhythm: Normal rate and regular rhythm.     Heart sounds: No murmur heard.   Pulmonary:     Effort: Pulmonary effort is normal. No respiratory distress.     Breath sounds: Normal breath sounds.  Abdominal:     General: Bowel sounds are normal.     Palpations: Abdomen is soft.     Tenderness: There is no abdominal tenderness. There is no guarding or rebound.  Genitourinary:    Comments: Normal vaginal vault.  No specific adnexal tenderness.  No cervical motion tenderness.  Does have scant blood from cervical os. Musculoskeletal:        General: Normal range of motion.     Cervical back: Neck supple.  Skin:    General: Skin is warm and dry.     Capillary Refill: Capillary refill takes less than 2 seconds.  Neurological:     General: No focal deficit present.     Mental Status: She is alert and oriented to person, place, and time.  Psychiatric:        Mood and Affect: Mood normal.        Behavior: Behavior normal.     ED Results / Procedures / Treatments   Labs (all labs ordered are listed, but only abnormal results are displayed) Labs Reviewed  COMPREHENSIVE METABOLIC PANEL - Abnormal; Notable for the following components:      Result Value   Glucose, Bld 101 (*)    All other components within normal limits  HCG, QUANTITATIVE, PREGNANCY - Abnormal; Notable for the following components:   hCG, Beta Chain, Quant, S 104 (*)    All other components within normal limits  WET PREP, GENITAL  CBC WITH DIFFERENTIAL/PLATELET  URINALYSIS, ROUTINE W REFLEX MICROSCOPIC  GC/CHLAMYDIA PROBE AMP ([place]) NOT AT [facility]    EKG None  Radiology US PELVIC DOPPLER (TORSION R/O OR MASS ARTERIAL FLOW)  Result Date: 10/18/2020 CLINICAL DATA:  Left lower quadrant pain and vaginal bleeding. EXAM: TRANSABDOMINAL AND TRANSVAGINAL ULTRASOUND OF PELVIS TECHNIQUE: Both transabdominal and transvaginal ultrasound examinations  of the pelvis were performed. Transabdominal technique was performed for global imaging of the pelvis including uterus, ovaries, adnexal regions, and pelvic cul-de-sac. It was necessary to proceed with endovaginal exam following the transabdominal exam to visualize the . COMPARISON:  None FINDINGS: Uterus Measurements: 7.8 x 3.3 x 4.0 cm = volume: 54 mL. No fibroids or other mass visualized. Endometrium Thickness: 6 mm.  No focal abnormality visualized. Right ovary Measurements: 3.3 x 2.1 x 1.8 cm = volume: 7 mL. Normal appearance/no adnexal mass. Left ovary Measurements: 2.8 x 2.0 x 2.5 cm = volume: 7 mL. Dominant follicle noted. No evidence for adnexal mass. Other findings No abnormal free fluid. IMPRESSION: Unremarkable exam. No findings to  explain the patient's history of left lower quadrant pain. Electronically Signed   By: Kennith Center M.D.   On: 10/18/2020 10:46   US PELVIC COMPLETE WITH TRANSVAGINAL  Addendum Date: 10/18/2020   ADDENDUM REPORT: 10/18/2020 13:14 ADDENDUM: Additional provided history demonstrates that patient is now noted to have a positive pregnancy test. Patient was brought back to ultrasound for Doppler assessment of the ovaries and this additional imaging demonstrates low resistance arterial and venous flow in both ovaries. Thorough evaluation of the adnexal regions demonstrates no evidence for adnexal mass to suggest ectopic gestation. There is no evidence for hemoperitoneum. With the provided history of positive pregnancy test, differential considerations for this study now include intrauterine gestation too early to visualize, completed abortion, or nonvisualized ectopic pregnancy. Electronically Signed   By: Kennith Center M.D.   On: 10/18/2020 13:14   Result Date: 10/18/2020 CLINICAL DATA:  Left lower quadrant pain and vaginal bleeding. EXAM: TRANSABDOMINAL AND TRANSVAGINAL ULTRASOUND OF PELVIS TECHNIQUE: Both transabdominal and transvaginal ultrasound examinations of the pelvis  were performed. Transabdominal technique was performed for global imaging of the pelvis including uterus, ovaries, adnexal regions, and pelvic cul-de-sac. It was necessary to proceed with endovaginal exam following the transabdominal exam to visualize the . COMPARISON:  None FINDINGS: Uterus Measurements: 7.8 x 3.3 x 4.0 cm = volume: 54 mL. No fibroids or other mass visualized. Endometrium Thickness: 6 mm.  No focal abnormality visualized. Right ovary Measurements: 3.3 x 2.1 x 1.8 cm = volume: 7 mL. Normal appearance/no adnexal mass. Left ovary Measurements: 2.8 x 2.0 x 2.5 cm = volume: 7 mL. Dominant follicle noted. No evidence for adnexal mass. Other findings No abnormal free fluid. IMPRESSION: Unremarkable exam. No findings to explain the patient's history of left lower quadrant pain. Electronically Signed: By: Kennith Center M.D. On: 10/18/2020 10:46    Procedures Procedures   Medications Ordered in ED Medications  morphine 4 MG/ML injection 4 mg (4 mg Intravenous Given 10/18/20 0801)    ED Course  I have reviewed the triage vital signs and the nursing notes.  Pertinent labs & imaging results that were available during my care of the patient were reviewed by me and considered in my medical decision making (see chart for details).    MDM Rules/Calculators/A&P                          25 year old female presents with left lower quadrant abdominal pain.  Vital signs are stable. She is tearful, but nontoxic appearing.  Exam reveals benign abdomen, no focal tenderness or signs of peritonitis.  She points to her left lower quadrant, but does not seem more tender there. Pelvic exam with vaginal bleeding, otherwise unremarkable.  Differential includes colitis, PID, ectopic pregnancy, diverticulosis, diverticulitis, pyelonephritis, nephrolithiasis, cystitis, STI, BV  As patient has vaginal bleeding, low suspicion for actual hematuria.  Labs show that patient is pregnant, hCG 103.  Kidney  function is normal.  CT unremarkable.  Wet prep normal.  Transvaginal ultrasound was performed.  No ectopic pregnancy.  No obvious IUP present, this is not unexpected given low hCG.  No adnexal abnormalities present.  Presentation most consistent with missed miscarriage.  Discussed findings with patient.  Not consistent with PID, diverticulitis, pyelonephritis, or nephrolithiasis.  Less likely urinary in etiology and patient has no urinary symptoms.  Believe patient stable for discharge, her pain is well controlled at this time.  She is ambulating without difficulty.  I spoke to  the provider and the MAU to help arrange follow-up.  Plan is for patient to go back to the MAU Saturday after 10 AM for second hCG test.  Patient feels comfortable with this plan.  We discussed symptomatic management and return precautions.  Recommended PCP and OB follow-up as well.  Patient discharged in stable condition.  Final Clinical Impression(s) / ED Diagnoses Final diagnoses:  Left lower quadrant pain  First trimester pregnancy    Rx / DC Orders ED Discharge Orders    None       Louretta Parma, DO 10/18/20 1510    Milagros Loll, MD 10/24/20 [phone]

## 2020-10-18 NOTE — ED Triage Notes (Signed)
Pt arrives via POV reports LLQ abdominal pain onset last night. She reports that when she urinated she saw a scant amount of blood on tissue. She reports this in ongoing this morning with pain and blood on tissue after wiping. She reports last menstrual cycle started on May 9 but did not last full cycle. She also reports taking plan B at beginning of month before cycle.

## 2020-10-18 NOTE — Discharge Instructions (Addendum)
Come back Saturday after 10 PM to the MAU for a repeat HCG [facility].

## 2020-10-19 LAB — GC/CHLAMYDIA PROBE AMP (~~LOC~~) NOT AT ARMC
Chlamydia: NEGATIVE
Comment: NEGATIVE
Comment: NORMAL
Neisseria Gonorrhea: NEGATIVE

## 2020-10-20 ENCOUNTER — Other Ambulatory Visit: Payer: Self-pay

## 2020-10-20 ENCOUNTER — Inpatient Hospital Stay (HOSPITAL_COMMUNITY)
Admission: AD | Admit: 2020-10-20 | Discharge: 2020-10-20 | Disposition: A | Discharge status: Home / Self Care | Payer: No Typology Code available for payment source | Attending: Obstetrics and Gynecology | Admitting: Obstetrics and Gynecology

## 2020-10-20 ENCOUNTER — Encounter (HOSPITAL_COMMUNITY): Payer: Self-pay | Admitting: Obstetrics and Gynecology

## 2020-10-20 DIAGNOSIS — Z3A00 Weeks of gestation of pregnancy not specified: Secondary | ICD-10-CM | POA: Diagnosis not present

## 2020-10-20 DIAGNOSIS — O009 Unspecified ectopic pregnancy without intrauterine pregnancy: Secondary | ICD-10-CM | POA: Insufficient documentation

## 2020-10-20 DIAGNOSIS — O3680X0 Pregnancy with inconclusive fetal viability, not applicable or unspecified: Secondary | ICD-10-CM | POA: Diagnosis not present

## 2020-10-20 LAB — HCG, QUANTITATIVE, PREGNANCY: hCG, Beta Chain, Quant, S: 196 m[IU]/mL — ABNORMAL HIGH (ref <5)

## 2020-10-20 NOTE — MAU Note (Signed)
Pt called for work note. Pt stated she will set up [facility]

## 2020-10-20 NOTE — Discharge Instructions (Signed)
Ectopic Pregnancy  An ectopic pregnancy happens when a fertilized egg attaches (implants) outside the uterus. In a normal pregnancy, a fertilized egg implants in the uterus. An ectopic pregnancy cannot develop into a healthy baby. Most ectopic pregnancies occur in one of the fallopian tubes, which is where an egg travels from an ovary to get to the uterus. This is called a tubal pregnancy. An ectopic pregnancy can also happen on an ovary, on the cervix, or in the abdomen. When a fertilized egg implants on tissue outside the uterus and begins to grow, it may cause the tissue to tear or burst. This is known as a ruptured ectopic pregnancy. The tear or burst causes internal bleeding. This may cause intense pain in the abdomen. An ectopic pregnancy is a medical emergency and can be life-threatening. What are the causes? The most common cause of this condition is damage to one of the fallopian tubes. A fallopian tube may be narrowed or blocked, and that stops the fertilized egg from reaching the uterus. Sometimes, the cause of this condition is not known. What increases the risk? The following factors may make you more likely to develop this condition:  Having gone through infertility treatment before.  Having had an ectopic pregnancy before.  Having had surgery to have the fallopian tubes tied.  Becoming pregnant while using an intrauterine device for birth control.  Taking birth control pills before the age of 16. Other risk factors include:  Smoking.  Alcohol use.  History of DES exposure. DES is a medicine that was used until 1971 and affected babies whose mothers took the medicine. What are the signs or symptoms? Common symptoms of this condition include:  Missing a menstrual period.  Nausea or tiredness.  Tender breasts.  Other normal pregnancy symptoms. Other symptoms may include:  Pain during sex.  Vaginal bleeding or spotting.  Cramping or pain in the lower abdomen.  A  fast heartbeat, low blood pressure, and sweating.  Pain or increased pressure while having a bowel movement. Symptoms of a ruptured ectopic pregnancy and internal bleeding may include:  Sudden, severe pain in the abdomen.  Dizziness, weakness, feeling light-headed, or fainting.  Pain in the shoulder or neck area. How is this diagnosed? This condition is diagnosed by:  A blood test to check for the pregnancy hormone.  A pelvic exam to find painful areas or a mass in the abdomen.  Ultrasound. A probe is inserted into the vagina to see if there is a pregnancy in or outside the uterus.  Taking a sample of tissue from the uterus.  Surgery to look closely at the fallopian tubes through an incision in the abdomen. How is this treated? This condition is usually treated with medicine or surgery. Sometimes, ectopic pregnancies can resolve on their own, under close monitoring by your health care provider. Medicine A medicine called methotrexate may be given to cause the pregnancy tissue to be absorbed. The medicine may be given if:  The diagnosis is made early, with no signs of active bleeding.  The fallopian tube has not torn or burst. You will need blood tests to make sure the medicine is working. It may take 4-6 weeks for the pregnancy tissues to be absorbed. Surgery Surgery may be performed to:  Remove the pregnancy tissue.  Stop internal bleeding.  Remove part or all of the fallopian tube.  Remove the uterus. This is rare. After surgery, you may need to have blood tests to make sure the surgery worked.   Follow these instructions at home: Medicines  Take over-the-counter and prescription medicines only as told by your health care provider.  Ask your health care provider if the medicine prescribed to you: ? Requires you to avoid driving or using machinery. ? Can cause constipation. You may need to take these actions to prevent or treat constipation:  Drink enough fluid to  keep your urine pale yellow.  Take over-the-counter or prescription medicines.  Eat foods that are high in fiber, such as beans, whole grains, and fresh fruits and vegetables.  Limit foods that are high in fat and processed sugars, such as fried or sweet foods. General instructions  Rest or limit your activity, if told by your health care provider.  Do not have sex or put anything in your vagina, such as tampons or douches, for 6 weeks or until your health care provider says it is safe.  Do not lift anything that is heavier than 10 lb (4.5 kg), or the limit that you are told, until your health care provider says that it is safe.  Return to your normal activities as told by your health care provider. Ask your health care provider what activities are safe for you.  Keep all follow-up visits. This is important. Contact a health care provider if:  You have a fever or chills.  You have nausea and vomiting. Get help right away if:  Your pain gets worse or is not relieved by medicine.  You feel dizzy or weak.  You feel light-headed or you faint.  You have sudden, severe pain in your abdomen.  You have sudden pain in the shoulder or neck area. Summary  An ectopic pregnancy happens when a fertilized egg implants outside the uterus. Most ectopic pregnancies occur in one of the fallopian tubes.  An ectopic pregnancy is a medical emergency and can be life-threatening.  The most common cause of this condition is damage to one of the fallopian tubes.  This condition is usually treated with medicine or surgery. Some ectopic pregnancies resolve on their own, under close monitoring by your health care provider. This information is not intended to replace advice given to you by your health care provider. Make sure you discuss any questions you have with your health care provider. Document Revised: 08/30/2019 Document Reviewed: 08/30/2019 Elsevier Patient Education  2021 [employer]vier Inc.  

## 2020-10-20 NOTE — MAU Note (Signed)
Presents for HCG level.  Denies abdominal pain, has VB.  VB enough to wear sanitary napkin.

## 2020-10-20 NOTE — MAU Provider Note (Addendum)
Subjective:  Helen Taylor is a 25 y.o. G1P0 at Unknown who presents today for FU BHCG. She was seen on 10/18/2020 in the ED. Results from that day show no IUP on [place], and HCG 104. She reports vaginal bleeding that is less than a period and dark brown in color. She denies abdominal or pelvic pain.   Objective:  Physical Exam  Nursing note and vitals reviewed.  Patient Vitals for the past 24 hrs:  BP Temp Temp src Pulse Resp SpO2 Height Weight  10/20/20 1413 (!) 124/59 98 F (36.7 C) Oral 70 18 99 % -- --  10/20/20 1407 -- -- -- -- -- -- 5\' 8"  (1.727 m) 76.2 kg   Constitutional: She is oriented to person, place, and time. She appears well-developed and well-nourished. No distress.  HENT:  Head: Normocephalic.  Cardiovascular: Normal rate.  Respiratory: Effort normal.  GI: Soft. There is no tenderness.  Neurological: She is alert and oriented to person, place, and time. Skin: Skin is warm and dry.  Psychiatric: She has a normal mood and affect.   Results for orders placed or performed during the hospital encounter of 10/20/20 (from the past 24 hour(s))  hCG, quantitative, pregnancy     Status: Abnormal   Collection Time: 10/20/20  3:00 PM  Result Value Ref Range   hCG, Beta Chain, Quant, S 196 (H) <5 mIU/mL   Assessment/Plan: Pregnancy of unknown location HCG did not rise appropriately, consulted with Dr. 10/22/20, will repeat hCG in 48hours FU in 2 days for: repeat hCG, appt scheduled at [facility] for Tuesday 10/23/2020 Pt had already left MAU while results were pending, pt called and identified by two identifiers, results given to patient, address and phone number of [facility] office given, discussed with patient that she will have blood drawn and be called later that day with results. Patient verbalized understanding and questions answered to patient satisfaction. Discussed with client the diagnosis of pregnancy of unknown anatomic location.  Three possibilities of outcome are: a healthy  pregnancy that is too early to see a yolk sac to confirm the pregnancy is in the uterus, a pregnancy that is not healthy and has not developed and will not develop, and an ectopic pregnancy that is in the abdomen that cannot be identified at this time.  And ectopic pregnancy can be a life threatening situation as a pregnancy needs to be in the uterus which is a muscle and can stretch to accommodate the growth of a pregnancy.  Other structures in the pelvis and abdomen as not muscular and do not stretch with the growth of a pregnancy.  Worst case scenario is that a structure ruptures with a growing pregnancy not in the uterus and and internal hemorrhage can be a life threatening situation.  We need to follow the progression of this pregnancy carefully.  We need to check another serum pregnancy hormone level to determine if the levels are rising appropriately  and to determine the next steps that are needed for you. Patient's questions were answered.  [facility], NP  6:14 PM 10/20/2020

## 2020-10-23 ENCOUNTER — Telehealth: Payer: Self-pay

## 2020-10-23 ENCOUNTER — Telehealth: Payer: Self-pay | Admitting: Obstetrics and Gynecology

## 2020-10-23 ENCOUNTER — Inpatient Hospital Stay (HOSPITAL_COMMUNITY)
Admission: AD | Admit: 2020-10-23 | Discharge: 2020-10-23 | Disposition: A | Discharge status: Home / Self Care | Payer: No Typology Code available for payment source | Attending: Obstetrics & Gynecology | Admitting: Obstetrics & Gynecology

## 2020-10-23 ENCOUNTER — Inpatient Hospital Stay (HOSPITAL_COMMUNITY): Payer: No Typology Code available for payment source

## 2020-10-23 ENCOUNTER — Other Ambulatory Visit (INDEPENDENT_AMBULATORY_CARE_PROVIDER_SITE_OTHER): Payer: No Typology Code available for payment source

## 2020-10-23 ENCOUNTER — Other Ambulatory Visit: Payer: Self-pay

## 2020-10-23 ENCOUNTER — Encounter (HOSPITAL_COMMUNITY): Payer: Self-pay | Admitting: Obstetrics & Gynecology

## 2020-10-23 ENCOUNTER — Encounter: Payer: Self-pay | Admitting: Obstetrics

## 2020-10-23 VITALS — BP 135/75 | HR 78 | Ht 68.0 in | Wt 170.0 lb

## 2020-10-23 DIAGNOSIS — O209 Hemorrhage in early pregnancy, unspecified: Secondary | ICD-10-CM | POA: Diagnosis not present

## 2020-10-23 DIAGNOSIS — Z3A01 Less than 8 weeks gestation of pregnancy: Secondary | ICD-10-CM | POA: Insufficient documentation

## 2020-10-23 DIAGNOSIS — R1032 Left lower quadrant pain: Secondary | ICD-10-CM | POA: Diagnosis present

## 2020-10-23 DIAGNOSIS — O3680X0 Pregnancy with inconclusive fetal viability, not applicable or unspecified: Secondary | ICD-10-CM | POA: Diagnosis not present

## 2020-10-23 DIAGNOSIS — R109 Unspecified abdominal pain: Secondary | ICD-10-CM

## 2020-10-23 DIAGNOSIS — O26891 Other specified pregnancy related conditions, first trimester: Secondary | ICD-10-CM | POA: Insufficient documentation

## 2020-10-23 DIAGNOSIS — O0281 Inappropriate change in quantitative human chorionic gonadotropin (hCG) in early pregnancy: Secondary | ICD-10-CM | POA: Diagnosis not present

## 2020-10-23 DIAGNOSIS — O26899 Other specified pregnancy related conditions, unspecified trimester: Secondary | ICD-10-CM

## 2020-10-23 LAB — HCG, QUANTITATIVE, PREGNANCY: hCG, Beta Chain, Quant, S: 258 m[IU]/mL — ABNORMAL HIGH (ref <5)

## 2020-10-23 MED ORDER — KETOROLAC TROMETHAMINE 60 MG/2ML IM SOLN
60.0000 mg | Freq: Once | INTRAMUSCULAR | Status: AC
  Administered 2020-10-23: 60 mg via INTRAMUSCULAR
  Filled 2020-10-23: qty 2

## 2020-10-23 MED ORDER — TRAMADOL HCL 50 MG PO TABS: 50.0000 mg | ORAL_TABLET | Freq: Four times a day (QID) | ORAL | 0 refills | Status: DC | PRN

## 2020-10-23 NOTE — Discharge Instructions (Signed)
You can take Ibuprofen 800 mg every 8 hours as needed for pain. Your next dose is 9:00 PM today.

## 2020-10-23 NOTE — MAU Provider Note (Signed)
[name]  is a 25 y.o. G1P0 at [redacted]w[redacted]d who presents to MAU today for follow-up quant hCG after 48 hours.   The patient was seen in MAU on 5/19 and 5/21 quant hCG of 104 and 196 US showed   US PELVIC COMPLETE WITH TRANSVAGINAL (Accession [id number]) (Order [id number])    ADDENDUM REPORT: 10/18/2020 13:14  ADDENDUM: Additional provided history demonstrates that patient is now noted to have a positive pregnancy test. Patient was brought back to ultrasound for Doppler assessment of the ovaries and this additional imaging demonstrates low resistance arterial and venous flow in both ovaries.  Thorough evaluation of the adnexal regions demonstrates no evidence for adnexal mass to suggest ectopic gestation. There is no evidence for hemoperitoneum.  With the provided history of positive pregnancy test, differential considerations for this study now include intrauterine gestation too early to visualize, completed abortion, or nonvisualized ectopic pregnancy.   Electronically Signed   By: Kennith Center M.D.   On: 10/18/2020 13:14   Addended by Kennith Center, MD on 10/18/2020 1:16 PM   Study Result  Narrative & Impression  CLINICAL DATA:  Left lower quadrant pain and vaginal bleeding.  EXAM: TRANSABDOMINAL AND TRANSVAGINAL ULTRASOUND OF PELVIS  TECHNIQUE: Both transabdominal and transvaginal ultrasound examinations of the pelvis were performed. Transabdominal technique was performed for global imaging of the pelvis including uterus, ovaries, adnexal regions, and pelvic cul-de-sac. It was necessary to proceed with endovaginal exam following the transabdominal exam to visualize the .  COMPARISON:  None  FINDINGS: Uterus  Measurements: 7.8 x 3.3 x 4.0 cm = volume: 54 mL. No fibroids or other mass visualized.  Endometrium  Thickness: 6 mm.  No focal abnormality visualized.  Right ovary  Measurements: 3.3 x 2.1 x 1.8 cm = volume: 7 mL.  Normal appearance/no adnexal mass.  Left ovary  Measurements: 2.8 x 2.0 x 2.5 cm = volume: 7 mL. Dominant follicle noted. No evidence for adnexal mass.  Other findings  No abnormal free fluid.  IMPRESSION: Unremarkable exam. No findings to explain the patient's history of left lower quadrant pain.  Electronically Signed: By: Kennith Center M.D. On: 10/18/2020 10:46      She endorses sharp, lower LT pelvic pain that is more than it was a few days ago. She rates the pain 7/10. Denies vaginal bleeding  Denies fever    OB History  Gravida Para Term Preterm AB Living  1            SAB IAB Ectopic Multiple Live Births               # Outcome Date GA Lbr Len/2nd Weight Sex Delivery Anes PTL Lv  1 Current             History reviewed. No pertinent past medical history.   BP 119/69 (BP Location: Right Arm)   Pulse 67   Temp 98.7 F (37.1 C) (Oral)   Resp 16   Ht 5\' 8"  (1.727 m)   Wt 79.1 kg   LMP 10/08/2020 (Approximate)   SpO2 98% Comment: room air  BMI 26.50 kg/m   CONSTITUTIONAL: Well-developed, well-nourished female in no acute distress.  MUSCULOSKELETAL: Normal range of motion.  CARDIOVASCULAR: Regular heart rate RESPIRATORY: Normal effort NEUROLOGICAL: Alert and oriented to person, place, and time.  SKIN: Skin is warm and dry. No rash noted. Not diaphoretic. No erythema. No pallor. PSYCH: Normal mood and affect. Normal behavior. Normal judgment and thought content.  Results for orders placed  or performed during the hospital encounter of 10/23/20 (from the past 24 hour(s))  hCG, quantitative, pregnancy     Status: Abnormal   Collection Time: 10/23/20  9:23 AM  Result Value Ref Range   hCG, Beta Chain, Quant, S 258 (H) <5 mIU/mL   US OB Transvaginal  Result Date: 10/23/2020 CLINICAL DATA:  Abdominal cramping and first-trimester pregnancy. Quantitative beta HCG is pending. Two week 1 day gestational age by LMP. EXAM: TRANSVAGINAL OB ULTRASOUND  TECHNIQUE: Transvaginal ultrasound was performed for complete evaluation of the gestation as well as the maternal uterus, adnexal regions, and pelvic cul-de-sac. COMPARISON:  The five days ago FINDINGS: Intra or extra uterine gestational sac is seen. The endometrial thickness has increased to 1 cm cyst in the left ovary which was clearly intra-ovarian on prior study continues to have a simple appearance but has increased in size by 1 cm, now 2.8 cm. No internal hemorrhage is seen. Small simple appearing fluid in the left hemipelvis. IMPRESSION: 1. Pregnancy of unknown location. Consider follow-up ultrasound in 10 days and serial quantitative beta HCG follow-up. 2. 2.8 cm left ovarian cyst which is increased in size since study 5 days ago. 3. Small volume and simple pelvic fluid. Electronically Signed   By: Marnee Spring M.D.   On: 10/23/2020 10:12    A: Inappropriate rise in quant hCG after 48 hours Pelvic Pain -- Toradol 60 mg IM -- pain improved; now rated 3/10 *Consult with Dr. Debroah Loop @ 1235 - notified of patient's complaints, assessments, lab & U/S results, recommended tx plan repeat HCG in 48 hours to avoid offering MTX too early - ok to d/c home afterwards  P: Discharge home First trimester/ectopic precautions discussed Patient will return for follow-up HCG in 48 hours. Order placed in sign and hold orders for 10/25/20.  Rx for Tramadol 50 mg every 6 hours prn pain. Advised to return to MAU for increased pain not adequately managed with medication given or prescribed. Patient may return to MAU as needed or if her condition were to change or worsen. Patient verbalized an understanding of the plan of care and agrees.    Raelyn Mora, CNM 10/23/2020 12:42 PM

## 2020-10-23 NOTE — MAU Note (Signed)
Helen Taylor is a 24 y.o. here in MAU reporting: here for follow up hcg. Was supposed to be seen in the office today but they sent here because of cramping. Started cramping on Saturday but yesterday started also having lower back pain. Took tylenol but pain is still there. Having light bleeding.   Onset of complaint: ongoing  Pain score: abdominal pain 7/10, back pain 7/10  Vitals:   10/23/20 0916  BP: 119/69  Pulse: 67  Resp: 16  Temp: 98.7 F (37.1 C)  SpO2: 98%     Lab orders placed from triage: hcg

## 2020-10-23 NOTE — Progress Notes (Signed)
Pt presents today for stat quant. Pt states she has been having some vaginal bleeding with cramping that has increased over the last 2 days. Pt states she has taken tylenol without relief. Pt states she was afraid to fall asleep last night due to the pain. Pt states pain is located mostly on the right side. Spoke with Dr. Jolayne Panther and she agreed patient should head to MAU for evaluation. Advised patient to go to MAU for further evaluation due to increased pain and bleeding.

## 2020-10-23 NOTE — Telephone Encounter (Signed)
TC from patient with additional questions about her ultrasound results and what to look for as a PUL. Patient voiced her concern with provider seeming frustrated with her questions. Apologies given to patient and explained that exceptional care for her is the ultimate goal of this provider and all [facility] providers. Re-explanation of what s/sx's to return to MAU and encouraged to pick up Rx for Tramadol. Advised to consistently take medication to stay on top of pain and also be able to know if the pain meds are not effective. Advised that pain is not relieved within 1-2 hours, she needs to return to MAU.  Patient voiced understanding and accepted apology.  Raelyn Mora, CNM

## 2020-10-25 ENCOUNTER — Inpatient Hospital Stay (HOSPITAL_COMMUNITY)
Admission: AD | Admit: 2020-10-25 | Discharge: 2020-10-25 | Disposition: A | Discharge status: Home / Self Care | Payer: No Typology Code available for payment source | Attending: Obstetrics & Gynecology | Admitting: Obstetrics & Gynecology

## 2020-10-25 ENCOUNTER — Inpatient Hospital Stay (HOSPITAL_COMMUNITY): Payer: No Typology Code available for payment source

## 2020-10-25 ENCOUNTER — Ambulatory Visit: Payer: No Typology Code available for payment source

## 2020-10-25 ENCOUNTER — Other Ambulatory Visit: Payer: Self-pay

## 2020-10-25 DIAGNOSIS — R109 Unspecified abdominal pain: Secondary | ICD-10-CM

## 2020-10-25 DIAGNOSIS — O3481 Maternal care for other abnormalities of pelvic organs, first trimester: Secondary | ICD-10-CM | POA: Diagnosis not present

## 2020-10-25 DIAGNOSIS — N83202 Unspecified ovarian cyst, left side: Secondary | ICD-10-CM | POA: Diagnosis not present

## 2020-10-25 DIAGNOSIS — O3680X0 Pregnancy with inconclusive fetal viability, not applicable or unspecified: Secondary | ICD-10-CM | POA: Diagnosis not present

## 2020-10-25 DIAGNOSIS — R1032 Left lower quadrant pain: Secondary | ICD-10-CM | POA: Diagnosis not present

## 2020-10-25 DIAGNOSIS — O209 Hemorrhage in early pregnancy, unspecified: Secondary | ICD-10-CM | POA: Insufficient documentation

## 2020-10-25 DIAGNOSIS — O26891 Other specified pregnancy related conditions, first trimester: Secondary | ICD-10-CM | POA: Diagnosis not present

## 2020-10-25 DIAGNOSIS — Z3A01 Less than 8 weeks gestation of pregnancy: Secondary | ICD-10-CM | POA: Insufficient documentation

## 2020-10-25 DIAGNOSIS — O26899 Other specified pregnancy related conditions, unspecified trimester: Secondary | ICD-10-CM

## 2020-10-25 LAB — COMPREHENSIVE METABOLIC PANEL
ALT: 13 U/L (ref 0–44)
AST: 16 U/L (ref 15–41)
Albumin: 4.1 g/dL (ref 3.5–5.0)
Alkaline Phosphatase: 49 U/L (ref 38–126)
Anion gap: 7 (ref 5–15)
BUN: 10 mg/dL (ref 6–20)
CO2: 25 mmol/L (ref 22–32)
Calcium: 8.9 mg/dL (ref 8.9–10.3)
Chloride: 105 mmol/L (ref 98–111)
Creatinine, Ser: 0.92 mg/dL (ref 0.44–1.00)
GFR, Estimated: >60 mL/min (ref >60)
Glucose, Bld: 83 mg/dL (ref 70–99)
Potassium: 4.1 mmol/L (ref 3.5–5.1)
Sodium: 137 mmol/L (ref 135–145)
Total Bilirubin: 0.9 mg/dL (ref 0.3–1.2)
Total Protein: 6.7 g/dL (ref 6.5–8.1)

## 2020-10-25 LAB — HCG, QUANTITATIVE, PREGNANCY: hCG, Beta Chain, Quant, S: 152 m[IU]/mL — ABNORMAL HIGH (ref <5)

## 2020-10-25 MED ORDER — METHOTREXATE FOR ECTOPIC PREGNANCY
50.0000 mg/m2 | Freq: Once | INTRAMUSCULAR | Status: AC
  Administered 2020-10-25: 96.5 mg via INTRAMUSCULAR
  Filled 2020-10-25: qty 3.86

## 2020-10-25 MED ORDER — OXYCODONE-ACETAMINOPHEN 5-325 MG PO TABS: 1.0000 | ORAL_TABLET | Freq: Four times a day (QID) | ORAL | 0 refills | Status: AC | PRN

## 2020-10-25 NOTE — Discharge Instructions (Signed)
Methotrexate Treatment for an Ectopic Pregnancy Methotrexate is a medicine that treats an ectopic pregnancy. In this type of pregnancy, the fertilized egg attaches (implants) outside the uterus. An ectopic pregnancy cannot develop into a healthy baby. Methotrexate works by stopping the growth of the fertilized egg. It also helps the body absorb tissue from the egg. This takes about 2-6 weeks. An ectopic pregnancy can be life-threatening. However, most ectopic pregnancies can be successfully treated with methotrexate if they are diagnosed early. Tell a health care provider about:  Any allergies you have.  All medicines you are taking, including vitamins, herbs, eye drops, creams, and over-the-counter medicines.  Any medical conditions you have. What are the risks? Generally, this is a safe treatment. However, problems may occur, including:  Digestive problems. You may have: ? Nausea. ? Vomiting. ? Diarrhea. ? Cramping in your abdomen.  Bleeding or spotting from your vagina.  Feeling dizzy or light-headed.  Mouth sores.  Inflammation of the lining of your lungs (pneumonitis).  Damage to nearby structures or organs, such as damage to the liver.  Hair loss. There is a risk that methotrexate treatment will fail and the pregnancy will continue. There is also a risk that the ectopic pregnancy might tear or burst (rupture) during use of this medicine. What happens before the procedure?  Blood tests will be done to check how your disease-fighting system (immune system), liver, and kidneys are working.  You will also have blood tests to measure your pregnancy hormone levels and to find out your blood type.  You will be given a shot of a medicine called Rho(D) immune globulin if: ? You are Rh-negative and the father is Rh-positive. ? You are Rh-negative and the father's Rh type is unknown. What happens during the procedure?  Methotrexate will be injected into your  muscle. ? Methotrexate may be given as a single dose of medicine or a series of doses over time, depending on your response to the treatment. ? Methotrexate injections are given by a health care provider. Injection is the most common way that this medicine is used to treat an ectopic pregnancy.  You may also receive other medicines to manage your ectopic pregnancy. The procedure may vary among health care providers and hospitals. What can I expect after treatment? After your treatment, it is common to have:  Cramping in your abdomen.  Bleeding in your vagina.  Tiredness (fatigue).  Nausea.  Vomiting.  Diarrhea. Blood tests will be done at timed intervals for several days or weeks to check your pregnancy hormone levels. The blood tests will be done until the pregnancy hormone can no longer be found in the blood. If the methotrexate treatment does not work, a surgical procedure may be done to remove the ectopic pregnancy. Follow these instructions at home: Medicines  Take over-the-counter and prescription medicines only as told by your health care provider.  Do not take prescription pain medicines, aspirin, ibuprofen, naproxen, or any other NSAIDs.  Do not take folic acid, prenatal vitamins, or other vitamins that contain folic acid. Activity  Do not have sex, douche, or put anything, such as tampons, in your vagina until your health care provider says it is okay.  Limit activities that take a lot of effort as told by your health care provider. General instructions  Do not drink alcohol.  Follow instructions from your health care provider about eating restrictions, such as avoiding foods that produce a lot of gas. These foods can hide the signs of a   ruptured ectopic pregnancy.  Limit exposure to sunlight or artificial UV light such as from tanning beds. Methotrexate can make you more sensitive to the sun.  Follow instructions from your health care provider on how and when to  report any symptoms that may indicate a ruptured ectopic pregnancy.  Keep all follow-up visits. This is important.   Contact a health care provider if:  You have persistent nausea and vomiting.  You have persistent diarrhea.  You are having a reaction to the medicine. This may include: ? Unusual fatigue. ? Skin rash. Get help right away if:  Pain in your abdomen or in the area between your hip bones (pelvic area) gets worse.  You have more bleeding from your vagina.  You feel light-headed or you faint.  You are short of breath.  Your heart rate increases.  You develop a cough.  You have chills or a fever. Summary  Methotrexate is a medicine that treats an ectopic pregnancy. This type of pregnancy forms outside the uterus.  There is a risk that methotrexate treatment will fail and the pregnancy will continue. There is also a risk that the ectopic pregnancy might tear or burst during use of this medicine.  This medicine may be given in a single dose or a series of doses over time.  After your treatment, blood tests will be done at timed intervals for several days or weeks to check your pregnancy hormone levels. The blood tests will be done until no more pregnancy hormone is found in the blood. This information is not intended to replace advice given to you by your health care provider. Make sure you discuss any questions you have with your health care provider. Document Revised: 11/02/2019 Document Reviewed: 11/02/2019 Elsevier Patient Education  2021 [employer]  

## 2020-10-25 NOTE — MAU Note (Signed)
Took Plan B last wk of April. prior LMP was between  4/7-4/9. Had bleeding 5/9-13. The following wk she started spotting and then that night started having "pain out of nowhere".  The next dayl she went to the hospital.

## 2020-10-25 NOTE — MAU Note (Signed)
Here for follow up blood work. Pt has not been feeling well.  Pain started last Thur, lower abd and LLQ. Was taking Ibuprofen, switched to Tramadol- takes away some pain- but wears.  Bleeding is heavier, changing tampons every 2 hrs.

## 2020-10-25 NOTE — MAU Provider Note (Signed)
History     CSN: [id number]  Arrival date and time: 10/25/20 7846   Event Date/Time   First Provider Initiated Contact with Patient 10/25/20 1020      Chief Complaint  Patient presents with  . Follow-up   HPI  Helen Taylor is a 25 y.o. G1P0 at [redacted]w[redacted]d who presents to MAU today for 48 hour repeat hCG level. She endorses increased LLQ pain rated at 10/10 at the worst and relieved only mildly by tramadol. She states that bleeding has increased significantly and is now including clots. She denies fever.    OB History    Gravida  1   Para      Term      Preterm      AB      Living        SAB      IAB      Ectopic      Multiple      Live Births              No past medical history on file.  No past surgical history on file.  No family history on file.  Social History   Tobacco Use  . Smoking status: Never Smoker  . Smokeless tobacco: Never Used  Vaping Use  . Vaping Use: Never used  Substance Use Topics  . Alcohol use: No    Comment: Occasionally  . Drug use: Yes    Types: Marijuana    Comment: occasionally    Allergies: No Known Allergies  Medications Prior to Admission  Medication Sig Dispense Refill Last Dose  . acetaminophen (TYLENOL) 500 MG tablet Take 1,000 mg by mouth every 6 (six) hours as needed for mild pain.     . cyclobenzaprine (FLEXERIL) 10 MG tablet Take 1 tablet (10 mg total) by mouth 2 (two) times daily as needed for muscle spasms. (Patient not taking: Reported on 10/23/2020) 20 tablet 0   . diphenhydrAMINE (BENADRYL) 25 MG tablet Take 1 tablet (25 mg total) by mouth every 6 (six) hours as needed for itching (Rash). (Patient not taking: Reported on 10/23/2020) 30 tablet 0   . HYDROcodone-acetaminophen (NORCO/VICODIN) 5-325 MG per tablet Take 1 tablet by mouth every 6 (six) hours as needed for moderate pain or severe pain. (Patient not taking: Reported on 10/23/2020) 6 tablet 0   . methocarbamol (ROBAXIN) 500 MG tablet Take 1 tablet  (500 mg total) by mouth 2 (two) times daily as needed for muscle spasms. (Patient not taking: Reported on 10/23/2020) 20 tablet 0   . ondansetron (ZOFRAN ODT) 4 MG disintegrating tablet Take 1 tablet (4 mg total) by mouth every 8 (eight) hours as needed for nausea or vomiting. (Patient not taking: Reported on 10/23/2020) 20 tablet 0   . traMADol (ULTRAM) 50 MG tablet Take 1 tablet (50 mg total) by mouth every 6 (six) hours as needed for up to 3 days for severe pain. 12 tablet 0     Review of Systems  Constitutional: Negative for fever.  Gastrointestinal: Positive for abdominal pain.  Genitourinary: Positive for vaginal bleeding.  Neurological: Negative for dizziness and syncope.   Physical Exam   Blood pressure 121/73, pulse 72, temperature 97.9 F (36.6 C), temperature source Oral, resp. rate 18, height 5\' 8"  (1.727 m), weight 77.5 kg, last menstrual period 10/08/2020, SpO2 100 %.  Physical Exam Vitals and nursing note reviewed.  Constitutional:      General: She is not in acute distress.  Appearance: She is well-developed and normal weight.  HENT:     Head: Normocephalic and atraumatic.  Cardiovascular:     Rate and Rhythm: Normal rate.  Pulmonary:     Effort: Pulmonary effort is normal.  Abdominal:     General: There is no distension.     Palpations: Abdomen is soft. There is no mass.     Tenderness: There is abdominal tenderness (mild tenderness to palpation of the LLQ). There is no guarding or rebound.  Skin:    General: Skin is warm and dry.     Findings: No erythema.  Neurological:     Mental Status: She is alert and oriented to person, place, and time.    Results for orders placed or performed during the hospital encounter of 10/25/20 (from the past 24 hour(s))  hCG, quantitative, pregnancy     Status: Abnormal   Collection Time: 10/25/20  9:59 AM  Result Value Ref Range   hCG, Beta Chain, Quant, S 152 (H) <5 mIU/mL  Comprehensive metabolic panel     Status: None    Collection Time: 10/25/20 11:53 AM  Result Value Ref Range   Sodium 137 135 - 145 mmol/L   Potassium 4.1 3.5 - 5.1 mmol/L   Chloride 105 98 - 111 mmol/L   CO2 25 22 - 32 mmol/L   Glucose, Bld 83 70 - 99 mg/dL   BUN 10 6 - 20 mg/dL   Creatinine, Ser 8.85 0.44 - 1.00 mg/dL   Calcium 8.9 8.9 - 02.7 mg/dL   Total Protein 6.7 6.5 - 8.1 g/dL   Albumin 4.1 3.5 - 5.0 g/dL   AST 16 15 - 41 U/L   ALT 13 0 - 44 U/L   Alkaline Phosphatase 49 38 - 126 U/L   Total Bilirubin 0.9 0.3 - 1.2 mg/dL   GFR, Estimated >74 >12 mL/min   Anion gap 7 5 - 15   US OB Transvaginal  Result Date: 10/25/2020 CLINICAL DATA:  25 year old with positive pregnancy test and left lower quadrant pain. Vaginal bleeding. EXAM: TRANSVAGINAL OB ULTRASOUND TECHNIQUE: Transvaginal ultrasound was performed for complete evaluation of the gestation as well as the maternal uterus, adnexal regions, and pelvic cul-de-sac. COMPARISON:  10/23/2020 and 10/18/2020 FINDINGS: Intrauterine gestational sac: None Yolk sac:  None Embryo:  None Maternal uterus/adnexae: Left ovary measures 5.4 x 4.6 x 4.7 cm. There is now a complex cystic structure associated with left ovary. This complex cystic structure measures 4.1 x 4.3 x 4.3 cm and measured 2.7 x 2.4 x 2.8 cm on 10/23/2020. Previously, the left ovarian cyst appeared more simple. In addition, there appears to be a small amount of complex free fluid in the pelvis which is new. Normal appearance of the right ovary measuring 2.2 x 1.8 x 2.8 cm. Endometrium measures 8 mm. No gross abnormality to the uterus. IMPRESSION: 1. Enlarging left ovarian cyst. This cyst is now complex and suggestive for a hemorrhagic cyst. In addition, there is complex fluid in the pelvis which could represent blood. If the patient did not have a pregnancy test, the findings would be suggestive for a ruptured hemorrhagic ovarian cyst. However, the patient has a positive pregnancy test and findings raise concern for an ectopic  pregnancy. No evidence for an intrauterine pregnancy and an ectopic pregnancy is not clearly identified. It is possible the patient has a ruptured hemorrhagic cyst with an early intrauterine pregnancy that is not visualized. Recommend correlation with hCG levels. 2. These results were called by  telephone at the time of interpretation on 10/25/2020 at 11:04 am to provider [facility] Ivyrose Hashman , who verbally acknowledged these results. Electronically Signed   By: Richarda OverlieAdam  Henn M.D.   On: 10/25/2020 11:06   US OB Transvaginal  Result Date: 10/23/2020 CLINICAL DATA:  Abdominal cramping and first-trimester pregnancy. Quantitative beta HCG is pending. Two week 1 day gestational age by LMP. EXAM: TRANSVAGINAL OB ULTRASOUND TECHNIQUE: Transvaginal ultrasound was performed for complete evaluation of the gestation as well as the maternal uterus, adnexal regions, and pelvic cul-de-sac. COMPARISON:  The five days ago FINDINGS: Intra or extra uterine gestational sac is seen. The endometrial thickness has increased to 1 cm cyst in the left ovary which was clearly intra-ovarian on prior study continues to have a simple appearance but has increased in size by 1 cm, now 2.8 cm. No internal hemorrhage is seen. Small simple appearing fluid in the left hemipelvis. IMPRESSION: 1. Pregnancy of unknown location. Consider follow-up ultrasound in 10 days and serial quantitative beta HCG follow-up. 2. 2.8 cm left ovarian cyst which is increased in size since study 5 days ago. 3. Small volume and simple pelvic fluid. Electronically Signed   By: Marnee SpringJonathon  Watts M.D.   On: 10/23/2020 10:12   US PELVIC DOPPLER (TORSION R/O OR MASS ARTERIAL FLOW)  Result Date: 10/18/2020 CLINICAL DATA:  Left lower quadrant pain and vaginal bleeding. EXAM: TRANSABDOMINAL AND TRANSVAGINAL ULTRASOUND OF PELVIS TECHNIQUE: Both transabdominal and transvaginal ultrasound examinations of the pelvis were performed. Transabdominal technique was performed for global imaging of  the pelvis including uterus, ovaries, adnexal regions, and pelvic cul-de-sac. It was necessary to proceed with endovaginal exam following the transabdominal exam to visualize the . COMPARISON:  None FINDINGS: Uterus Measurements: 7.8 x 3.3 x 4.0 cm = volume: 54 mL. No fibroids or other mass visualized. Endometrium Thickness: 6 mm.  No focal abnormality visualized. Right ovary Measurements: 3.3 x 2.1 x 1.8 cm = volume: 7 mL. Normal appearance/no adnexal mass. Left ovary Measurements: 2.8 x 2.0 x 2.5 cm = volume: 7 mL. Dominant follicle noted. No evidence for adnexal mass. Other findings No abnormal free fluid. IMPRESSION: Unremarkable exam. No findings to explain the patient's history of left lower quadrant pain. Electronically Signed   By: Kennith CenterEric  Mansell M.D.   On: 10/18/2020 10:46   US PELVIC COMPLETE WITH TRANSVAGINAL  Addendum Date: 10/18/2020   ADDENDUM REPORT: 10/18/2020 13:14 ADDENDUM: Additional provided history demonstrates that patient is now noted to have a positive pregnancy test. Patient was brought back to ultrasound for Doppler assessment of the ovaries and this additional imaging demonstrates low resistance arterial and venous flow in both ovaries. Thorough evaluation of the adnexal regions demonstrates no evidence for adnexal mass to suggest ectopic gestation. There is no evidence for hemoperitoneum. With the provided history of positive pregnancy test, differential considerations for this study now include intrauterine gestation too early to visualize, completed abortion, or nonvisualized ectopic pregnancy. Electronically Signed   By: Kennith CenterEric  Mansell M.D.   On: 10/18/2020 13:14   Result Date: 10/18/2020 CLINICAL DATA:  Left lower quadrant pain and vaginal bleeding. EXAM: TRANSABDOMINAL AND TRANSVAGINAL ULTRASOUND OF PELVIS TECHNIQUE: Both transabdominal and transvaginal ultrasound examinations of the pelvis were performed. Transabdominal technique was performed for global imaging of the pelvis  including uterus, ovaries, adnexal regions, and pelvic cul-de-sac. It was necessary to proceed with endovaginal exam following the transabdominal exam to visualize the . COMPARISON:  None FINDINGS: Uterus Measurements: 7.8 x 3.3 x 4.0 cm = volume: 54  mL. No fibroids or other mass visualized. Endometrium Thickness: 6 mm.  No focal abnormality visualized. Right ovary Measurements: 3.3 x 2.1 x 1.8 cm = volume: 7 mL. Normal appearance/no adnexal mass. Left ovary Measurements: 2.8 x 2.0 x 2.5 cm = volume: 7 mL. Dominant follicle noted. No evidence for adnexal mass. Other findings No abnormal free fluid. IMPRESSION: Unremarkable exam. No findings to explain the patient's history of left lower quadrant pain. Electronically Signed: By: Kennith Center M.D. On: 10/18/2020 10:46    MAU Course  Procedures  MDM [place] and hCG today  Discussed [place] results with radiology. PUL and enlarging hemorrhagic cyst suspected HCG continues to plateau after 4 repeat hCG tests Discussed patient's results and increasing pain with Dr. Macon Large. She agrees with plan for MTX today. CMP added and MTX ordered. Patient counseled on MTX and given reading materials. She is agreeable to injection today.   Assessment and Plan  A: PUL, concern for ectopic pregnancy Left hemorrhagic ovarian cyst  P:  Discharge home Rx for Percocet sent to patient's pharmacy  Advised to monitor for worsening pain and take only as directed Ectopic and ovarian torsion precautions discussed Patient advised to follow-up with MAU on Sunday for day #4 hCG and at [facility] on 10/31/20 at 1pm for day #7 hCG level Patient will need GYN [place] in 1-3 months if pain persists or worsens associated with the cyst and then follow-up as indicated Patient may return to MAU as needed or if her condition were to change or worsen   Vonzella Nipple, PA-C 10/25/2020, 12:15 PM

## 2020-10-28 ENCOUNTER — Inpatient Hospital Stay (HOSPITAL_COMMUNITY)
Admission: AD | Admit: 2020-10-28 | Discharge: 2020-10-28 | Disposition: A | Discharge status: Home / Self Care | Payer: No Typology Code available for payment source | Attending: Family Medicine | Admitting: Family Medicine

## 2020-10-28 ENCOUNTER — Other Ambulatory Visit: Payer: Self-pay

## 2020-10-28 DIAGNOSIS — O00102 Left tubal pregnancy without intrauterine pregnancy: Secondary | ICD-10-CM

## 2020-10-28 DIAGNOSIS — O009 Unspecified ectopic pregnancy without intrauterine pregnancy: Secondary | ICD-10-CM

## 2020-10-28 DIAGNOSIS — Z3A01 Less than 8 weeks gestation of pregnancy: Secondary | ICD-10-CM | POA: Insufficient documentation

## 2020-10-28 DIAGNOSIS — Z6790 Unspecified blood type, Rh positive: Secondary | ICD-10-CM

## 2020-10-28 LAB — HCG, QUANTITATIVE, PREGNANCY: hCG, Beta Chain, Quant, S: 60 m[IU]/mL — ABNORMAL HIGH (ref <5)

## 2020-10-28 LAB — ABO/RH: ABO/RH(D): O POS

## 2020-10-28 NOTE — MAU Provider Note (Signed)
Subjective:  Helen Taylor is a 25 y.o. G1P0 at [redacted]w[redacted]d who presents today for FU BHCG. She was seen on 10/25/2020 and diagnosed with an ectopic pregnancy and given MTX. Results from that day show possible left ectopic pregnancy on [place], and HCG 152. She reports vaginal bleeding, but less than since her last visit. She denies abdominal or pelvic pain.   Objective:  Physical Exam  Nursing note and vitals reviewed.  Patient Vitals for the past 24 hrs:  BP Temp Temp src Pulse Resp SpO2  10/28/20 1152 122/73 98.1 F (36.7 C) Oral 86 16 98 %   Constitutional: She is oriented to person, place, and time. She appears well-developed and well-nourished. No distress.  HENT:  Head: Normocephalic.  Cardiovascular: Normal rate.  Respiratory: Effort normal.  GI: Soft. There is no tenderness.  Neurological: She is alert and oriented to person, place, and time. Skin: Skin is warm and dry.  Psychiatric: She has a normal mood and affect.   Results for orders placed or performed during the hospital encounter of 10/28/20 (from the past 24 hour(s))  hCG, quantitative, pregnancy     Status: Abnormal   Collection Time: 10/28/20 11:57 AM  Result Value Ref Range   hCG, Beta Chain, Quant, S 60 (H) <5 mIU/mL  ABO/Rh     Status: None   Collection Time: 10/28/20  1:09 PM  Result Value Ref Range   ABO/RH(D) O POS    No rh immune globuloin      NOT A RH IMMUNE GLOBULIN CANDIDATE, PT RH POSITIVE Performed at [facility], [address], [place], [place] [address]     Assessment/Plan: Ectopic pregnancy Day 4 s/p MTX Pt left MAU and waiting at home for results. Patient called and identified by two identifiers. Results given to pt per below. Patient verbalizes understanding and questions answered to patient satisfaction. Pt advised to keep appt at [facility] as planned. HCG 60 ABO: O Positive FU at [facility] as planned on 10/31/2020 Return MAU precautions given Pt discharged to home in stable condition  Helen Taylor,  Helen Sera, NP  2:49 PM 10/28/2020

## 2020-10-28 NOTE — Discharge Instructions (Signed)
Methotrexate Treatment for an Ectopic Pregnancy Methotrexate is a medicine that treats an ectopic pregnancy. In this type of pregnancy, the fertilized egg attaches (implants) outside the uterus. An ectopic pregnancy cannot develop into a healthy baby. Methotrexate works by stopping the growth of the fertilized egg. It also helps the body absorb tissue from the egg. This takes about 2-6 weeks. An ectopic pregnancy can be life-threatening. However, most ectopic pregnancies can be successfully treated with methotrexate if they are diagnosed early. Tell a health care provider about:  Any allergies you have.  All medicines you are taking, including vitamins, herbs, eye drops, creams, and over-the-counter medicines.  Any medical conditions you have. What are the risks? Generally, this is a safe treatment. However, problems may occur, including:  Digestive problems. You may have: ? Nausea. ? Vomiting. ? Diarrhea. ? Cramping in your abdomen.  Bleeding or spotting from your vagina.  Feeling dizzy or light-headed.  Mouth sores.  Inflammation of the lining of your lungs (pneumonitis).  Damage to nearby structures or organs, such as damage to the liver.  Hair loss. There is a risk that methotrexate treatment will fail and the pregnancy will continue. There is also a risk that the ectopic pregnancy might tear or burst (rupture) during use of this medicine. What happens before the procedure?  Blood tests will be done to check how your disease-fighting system (immune system), liver, and kidneys are working.  You will also have blood tests to measure your pregnancy hormone levels and to find out your blood type.  You will be given a shot of a medicine called Rho(D) immune globulin if: ? You are Rh-negative and the father is Rh-positive. ? You are Rh-negative and the father's Rh type is unknown. What happens during the procedure?  Methotrexate will be injected into your  muscle. ? Methotrexate may be given as a single dose of medicine or a series of doses over time, depending on your response to the treatment. ? Methotrexate injections are given by a health care provider. Injection is the most common way that this medicine is used to treat an ectopic pregnancy.  You may also receive other medicines to manage your ectopic pregnancy. The procedure may vary among health care providers and hospitals. What can I expect after treatment? After your treatment, it is common to have:  Cramping in your abdomen.  Bleeding in your vagina.  Tiredness (fatigue).  Nausea.  Vomiting.  Diarrhea. Blood tests will be done at timed intervals for several days or weeks to check your pregnancy hormone levels. The blood tests will be done until the pregnancy hormone can no longer be found in the blood. If the methotrexate treatment does not work, a surgical procedure may be done to remove the ectopic pregnancy. Follow these instructions at home: Medicines  Take over-the-counter and prescription medicines only as told by your health care provider.  Do not take prescription pain medicines, aspirin, ibuprofen, naproxen, or any other NSAIDs.  Do not take folic acid, prenatal vitamins, or other vitamins that contain folic acid. Activity  Do not have sex, douche, or put anything, such as tampons, in your vagina until your health care provider says it is okay.  Limit activities that take a lot of effort as told by your health care provider. General instructions  Do not drink alcohol.  Follow instructions from your health care provider about eating restrictions, such as avoiding foods that produce a lot of gas. These foods can hide the signs of a   ruptured ectopic pregnancy.  Limit exposure to sunlight or artificial UV light such as from tanning beds. Methotrexate can make you more sensitive to the sun.  Follow instructions from your health care provider on how and when to  report any symptoms that may indicate a ruptured ectopic pregnancy.  Keep all follow-up visits. This is important.   Contact a health care provider if:  You have persistent nausea and vomiting.  You have persistent diarrhea.  You are having a reaction to the medicine. This may include: ? Unusual fatigue. ? Skin rash. Get help right away if:  Pain in your abdomen or in the area between your hip bones (pelvic area) gets worse.  You have more bleeding from your vagina.  You feel light-headed or you faint.  You are short of breath.  Your heart rate increases.  You develop a cough.  You have chills or a fever. Summary  Methotrexate is a medicine that treats an ectopic pregnancy. This type of pregnancy forms outside the uterus.  There is a risk that methotrexate treatment will fail and the pregnancy will continue. There is also a risk that the ectopic pregnancy might tear or burst during use of this medicine.  This medicine may be given in a single dose or a series of doses over time.  After your treatment, blood tests will be done at timed intervals for several days or weeks to check your pregnancy hormone levels. The blood tests will be done until no more pregnancy hormone is found in the blood. This information is not intended to replace advice given to you by your health care provider. Make sure you discuss any questions you have with your health care provider. Document Revised: 11/02/2019 Document Reviewed: 11/02/2019 Elsevier Patient Education  2021 [employer]         Ectopic Pregnancy  An ectopic pregnancy happens when a fertilized egg attaches (implants) outside the uterus. In a normal pregnancy, a fertilized egg implants in the uterus. An ectopic pregnancy cannot develop into a healthy baby. Most ectopic pregnancies occur in one of the fallopian tubes, which is where an egg travels from an ovary to get to the uterus. This is called a tubal pregnancy. An ectopic  pregnancy can also happen on an ovary, on the cervix, or in the abdomen. When a fertilized egg implants on tissue outside the uterus and begins to grow, it may cause the tissue to tear or burst. This is known as a ruptured ectopic pregnancy. The tear or burst causes internal bleeding. This may cause intense pain in the abdomen. An ectopic pregnancy is a medical emergency and can be life-threatening. What are the causes? The most common cause of this condition is damage to one of the fallopian tubes. A fallopian tube may be narrowed or blocked, and that stops the fertilized egg from reaching the uterus. Sometimes, the cause of this condition is not known. What increases the risk? The following factors may make you more likely to develop this condition:  Having gone through infertility treatment before.  Having had an ectopic pregnancy before.  Having had surgery to have the fallopian tubes tied.  Becoming pregnant while using an intrauterine device for birth control.  Taking birth control pills before the age of 80. Other risk factors include:  Smoking.  Alcohol use.  History of DES exposure. DES is a medicine that was used until 1971 and affected babies whose mothers took the medicine. What are the signs or symptoms? Common symptoms  of this condition include:  Missing a menstrual period.  Nausea or tiredness.  Tender breasts.  Other normal pregnancy symptoms. Other symptoms may include:  Pain during sex.  Vaginal bleeding or spotting.  Cramping or pain in the lower abdomen.  A fast heartbeat, low blood pressure, and sweating.  Pain or increased pressure while having a bowel movement. Symptoms of a ruptured ectopic pregnancy and internal bleeding may include:  Sudden, severe pain in the abdomen.  Dizziness, weakness, feeling light-headed, or fainting.  Pain in the shoulder or neck area. How is this diagnosed? This condition is diagnosed by:  A blood test to check  for the pregnancy hormone.  A pelvic exam to find painful areas or a mass in the abdomen.  Ultrasound. A probe is inserted into the vagina to see if there is a pregnancy in or outside the uterus.  Taking a sample of tissue from the uterus.  Surgery to look closely at the fallopian tubes through an incision in the abdomen. How is this treated? This condition is usually treated with medicine or surgery. Sometimes, ectopic pregnancies can resolve on their own, under close monitoring by your health care provider. Medicine A medicine called methotrexate may be given to cause the pregnancy tissue to be absorbed. The medicine may be given if:  The diagnosis is made early, with no signs of active bleeding.  The fallopian tube has not torn or burst. You will need blood tests to make sure the medicine is working. It may take 4-6 weeks for the pregnancy tissues to be absorbed. Surgery Surgery may be performed to:  Remove the pregnancy tissue.  Stop internal bleeding.  Remove part or all of the fallopian tube.  Remove the uterus. This is rare. After surgery, you may need to have blood tests to make sure the surgery worked. Follow these instructions at home: Medicines  Take over-the-counter and prescription medicines only as told by your health care provider.  Ask your health care provider if the medicine prescribed to you: ? Requires you to avoid driving or using machinery. ? Can cause constipation. You may need to take these actions to prevent or treat constipation:  Drink enough fluid to keep your urine pale yellow.  Take over-the-counter or prescription medicines.  Eat foods that are high in fiber, such as beans, whole grains, and fresh fruits and vegetables.  Limit foods that are high in fat and processed sugars, such as fried or sweet foods. General instructions  Rest or limit your activity, if told by your health care provider.  Do not have sex or put anything in your  vagina, such as tampons or douches, for 6 weeks or until your health care provider says it is safe.  Do not lift anything that is heavier than 10 lb (4.5 kg), or the limit that you are told, until your health care provider says that it is safe.  Return to your normal activities as told by your health care provider. Ask your health care provider what activities are safe for you.  Keep all follow-up visits. This is important. Contact a health care provider if:  You have a fever or chills.  You have nausea and vomiting. Get help right away if:  Your pain gets worse or is not relieved by medicine.  You feel dizzy or weak.  You feel light-headed or you faint.  You have sudden, severe pain in your abdomen.  You have sudden pain in the shoulder or neck area. Summary  An ectopic pregnancy happens when a fertilized egg implants outside the uterus. Most ectopic pregnancies occur in one of the fallopian tubes.  An ectopic pregnancy is a medical emergency and can be life-threatening.  The most common cause of this condition is damage to one of the fallopian tubes.  This condition is usually treated with medicine or surgery. Some ectopic pregnancies resolve on their own, under close monitoring by your health care provider. This information is not intended to replace advice given to you by your health care provider. Make sure you discuss any questions you have with your health care provider. Document Revised: 08/30/2019 Document Reviewed: 08/30/2019 Elsevier Patient Education  2021 [employer].         Ruptured Ectopic Pregnancy  An ectopic pregnancy happens when a fertilized egg attaches (implants) outside the uterus, usually in one of the fallopian tubes. This is where an egg travels from an ovary to get to the uterus. An ectopic pregnancy cannot develop into a healthy baby. When a fertilized egg implants on tissue outside the uterus and begins to grow, it may cause the tissue to  tear or burst. This is known as a ruptured ectopic pregnancy. The tear or burst causes internal bleeding. This may cause intense pain in the abdomen. A ruptured ectopic pregnancy can affect the ability to have children (fertility), depending on damage it causes to the reproductive organs. A ruptured ectopic pregnancy is a medical emergency. If not treated right away, it can lead to blood loss or shock, and it can be life-threatening. What are the causes? An ectopic pregnancy ruptures because it is growing in a spot that is not meant to expand and support the growth of a pregnancy. What increases the risk? You are more likely to have a ruptured ectopic pregnancy if:  You have an ectopic pregnancy, but you do not have any symptoms and the pregnancy is not found early enough to treat it before it ruptures.  You have nonsurgical treatment of an ectopic pregnancy.  You choose not to have any treatment for an ectopic pregnancy. What are the signs or symptoms? Symptoms of a ruptured ectopic pregnancy and internal bleeding may include:  Sudden, severe pain in the abdomen.  Feeling dizzy, weak, or light-headed.  Fainting.  Pain in the shoulder or neck area. How is this diagnosed? This condition is diagnosed based on your medical history, symptoms, a physical exam, and testing. Testing may include an ultrasound and blood tests. How is this treated? This condition is treated with IV fluids and emergency surgery to remove the ectopic pregnancy and repair the area where the rupture occurred. If a lot of blood was lost, donated blood may be needed (blood transfusion). You may receive a Rho (D) immune globulin shot if you are Rh negative and your baby's father is Rh positive, or if the Rh type of the father is unknown. This shot is given to prevent Rh problems in future pregnancies. You may receive other medicines. Summary  An ectopic pregnancy happens when a fertilized egg attaches (implants)  outside the uterus, usually in one of the fallopian tubes. When a fertilized egg implants on tissue outside the uterus and begins to grow, it may cause the tissue to tear or burst. This is known as a ruptured ectopic pregnancy.  A ruptured ectopic pregnancy is a medical emergency. If not treated right away, it can lead to blood loss or shock, and it can be life-threatening.  This condition is treated with IV  fluids and emergency surgery to remove the ectopic pregnancy and repair the area where the rupture occurred. If a lot of blood was lost, donated blood may be needed. This information is not intended to replace advice given to you by your health care provider. Make sure you discuss any questions you have with your health care provider. Document Revised: 08/30/2019 Document Reviewed: 08/30/2019 Elsevier Patient Education  2021 [employer].

## 2020-10-28 NOTE — MAU Note (Signed)
Helen Taylor is a 25 y.o. at [redacted]w[redacted]d here in MAU reporting: here for day 4 labs post MTX. States bleeding has gotten to be very light and pain has gone away. Had some pains yesterday and felt bloated but denies that pain at this time.  Onset of complaint: ongoing  Pain score: 0/10  Vitals:   10/28/20 1152  BP: 122/73  Pulse: 86  Resp: 16  Temp: 98.1 F (36.7 C)  SpO2: 98%     Lab orders placed from triage: hcg

## 2020-10-31 ENCOUNTER — Other Ambulatory Visit: Payer: Self-pay

## 2020-10-31 ENCOUNTER — Ambulatory Visit (INDEPENDENT_AMBULATORY_CARE_PROVIDER_SITE_OTHER): Payer: No Typology Code available for payment source

## 2020-10-31 ENCOUNTER — Encounter: Payer: Self-pay | Admitting: Family Medicine

## 2020-10-31 VITALS — BP 115/72 | HR 61 | Wt 166.0 lb

## 2020-10-31 DIAGNOSIS — O009 Unspecified ectopic pregnancy without intrauterine pregnancy: Secondary | ICD-10-CM

## 2020-10-31 NOTE — Progress Notes (Signed)
4:24 pm. Patient notified of results and the need to repeat HCG in 1 Week per Joni Reining Nugent.  She was transferred to the front Office to schedule appointment.  Patient verbalized understanding and agreement.

## 2020-10-31 NOTE — Progress Notes (Addendum)
Subjective:  Helen Taylor is a 25 y.o. female here for Follow up HCG.  Objective:  BP 115/72   Pulse 61   Wt 166 lb (75.3 kg)   LMP 10/08/2020 (Approximate)   BMI 25.24 kg/m   Appearance alert, well appearing, and in no distress. General exam BP noted to be well controlled today in office.    Assessment:   Day 7 s/p MTX  Pt reports light bleeding.  Denies abdominal or pelvic pain or fever.   Plan:  STAT HCG drawn and sent to lab, will call patient with results.    Patient was assessed and managed by nursing staff during this encounter. I have reviewed the chart and agree with the documentation and plan. I have also made any necessary editorial changes.  Marylen Ponto, NP 10/31/2020 1:32 PM

## 2020-11-01 LAB — BETA HCG QUANT (REF LAB): hCG Quant: 14 m[IU]/mL

## 2020-11-07 ENCOUNTER — Other Ambulatory Visit: Payer: Self-pay

## 2020-11-07 ENCOUNTER — Ambulatory Visit (INDEPENDENT_AMBULATORY_CARE_PROVIDER_SITE_OTHER): Payer: No Typology Code available for payment source

## 2020-11-07 DIAGNOSIS — O009 Unspecified ectopic pregnancy without intrauterine pregnancy: Secondary | ICD-10-CM

## 2020-11-07 NOTE — Progress Notes (Signed)
Pt is in the office for repeat HCG following recent ectopic pregnancy . Last HCG was on 10-31-20 Pt denies pain or bleeding today.

## 2020-11-07 NOTE — Progress Notes (Signed)
Chart reviewed for nurse visit. Agree with plan of care.   HCG has been downtrending appropriately and last level was 14, today's level still pending.   Venora Maples, MD 11/07/20 2:46 PM

## 2020-11-08 LAB — BETA HCG QUANT (REF LAB): hCG Quant: 1 m[IU]/mL

## 2020-11-14 NOTE — Telephone Encounter (Signed)
Entered in error

## 2022-09-29 ENCOUNTER — Other Ambulatory Visit: Payer: Self-pay

## 2022-09-29 ENCOUNTER — Emergency Department (HOSPITAL_COMMUNITY)
Admission: EM | Admit: 2022-09-29 | Discharge: 2022-09-29 | Disposition: A | Discharge status: Home / Self Care | Payer: PRIVATE HEALTH INSURANCE | Attending: Emergency Medicine | Admitting: Emergency Medicine

## 2022-09-29 ENCOUNTER — Encounter (HOSPITAL_COMMUNITY): Payer: Self-pay

## 2022-09-29 DIAGNOSIS — R519 Headache, unspecified: Secondary | ICD-10-CM

## 2022-09-29 DIAGNOSIS — Z1152 Encounter for screening for COVID-19: Secondary | ICD-10-CM | POA: Diagnosis not present

## 2022-09-29 DIAGNOSIS — B349 Viral infection, unspecified: Secondary | ICD-10-CM | POA: Diagnosis not present

## 2022-09-29 DIAGNOSIS — R112 Nausea with vomiting, unspecified: Secondary | ICD-10-CM | POA: Diagnosis present

## 2022-09-29 LAB — URINALYSIS, ROUTINE W REFLEX MICROSCOPIC
Bilirubin Urine: NEGATIVE
Glucose, UA: NEGATIVE mg/dL
Hgb urine dipstick: NEGATIVE
Ketones, ur: 80 mg/dL — AB
Nitrite: NEGATIVE
Protein, ur: 30 mg/dL — AB
Specific Gravity, Urine: 1.019 (ref 1.005–1.030)
pH: 8 (ref 5.0–8.0)

## 2022-09-29 LAB — CBC WITH DIFFERENTIAL/PLATELET
Abs Immature Granulocytes: 0.04 10*3/uL (ref 0.00–0.07)
Basophils Absolute: 0 10*3/uL (ref 0.0–0.1)
Basophils Relative: 0 %
Eosinophils Absolute: 0 10*3/uL (ref 0.0–0.5)
Eosinophils Relative: 0 %
HCT: 41 % (ref 36.0–46.0)
Hemoglobin: 13.7 g/dL (ref 12.0–15.0)
Immature Granulocytes: 0 %
Lymphocytes Relative: 11 %
Lymphs Abs: 1.2 10*3/uL (ref 0.7–4.0)
MCH: 29.5 pg (ref 26.0–34.0)
MCHC: 33.4 g/dL (ref 30.0–36.0)
MCV: 88.4 fL (ref 80.0–100.0)
Monocytes Absolute: 0.7 10*3/uL (ref 0.1–1.0)
Monocytes Relative: 6 %
Neutro Abs: 8.5 10*3/uL — ABNORMAL HIGH (ref 1.7–7.7)
Neutrophils Relative %: 83 %
Platelets: 264 10*3/uL (ref 150–400)
RBC: 4.64 MIL/uL (ref 3.87–5.11)
RDW: 12.4 % (ref 11.5–15.5)
WBC: 10.4 10*3/uL (ref 4.0–10.5)
nRBC: 0 % (ref 0.0–0.2)

## 2022-09-29 LAB — COMPREHENSIVE METABOLIC PANEL
ALT: 16 U/L (ref 0–44)
AST: 18 U/L (ref 15–41)
Albumin: 4.4 g/dL (ref 3.5–5.0)
Alkaline Phosphatase: 60 U/L (ref 38–126)
Anion gap: 12 (ref 5–15)
BUN: 8 mg/dL (ref 6–20)
CO2: 20 mmol/L — ABNORMAL LOW (ref 22–32)
Calcium: 9 mg/dL (ref 8.9–10.3)
Chloride: 103 mmol/L (ref 98–111)
Creatinine, Ser: 0.9 mg/dL (ref 0.44–1.00)
GFR, Estimated: >60 mL/min (ref >60)
Glucose, Bld: 109 mg/dL — ABNORMAL HIGH (ref 70–99)
Potassium: 3.6 mmol/L (ref 3.5–5.1)
Sodium: 135 mmol/L (ref 135–145)
Total Bilirubin: 0.9 mg/dL (ref 0.3–1.2)
Total Protein: 7.5 g/dL (ref 6.5–8.1)

## 2022-09-29 LAB — RESP PANEL BY RT-PCR (RSV, FLU A&B, COVID)  RVPGX2
Influenza A by PCR: NEGATIVE
Influenza B by PCR: NEGATIVE
Resp Syncytial Virus by PCR: NEGATIVE
SARS Coronavirus 2 by RT PCR: NEGATIVE

## 2022-09-29 LAB — LIPASE, BLOOD: Lipase: 25 U/L (ref 11–51)

## 2022-09-29 LAB — PREGNANCY, URINE: Preg Test, Ur: NEGATIVE

## 2022-09-29 LAB — GROUP A STREP BY PCR: Group A Strep by PCR: NOT DETECTED

## 2022-09-29 MED ORDER — METOCLOPRAMIDE HCL 5 MG/ML IJ SOLN
10.0000 mg | Freq: Once | INTRAMUSCULAR | Status: AC
  Administered 2022-09-29: 10 mg via INTRAVENOUS
  Filled 2022-09-29: qty 2

## 2022-09-29 MED ORDER — DIPHENHYDRAMINE HCL 50 MG/ML IJ SOLN
25.0000 mg | Freq: Once | INTRAMUSCULAR | Status: AC
  Administered 2022-09-29: 25 mg via INTRAVENOUS
  Filled 2022-09-29: qty 1

## 2022-09-29 MED ORDER — SODIUM CHLORIDE 0.9 % IV BOLUS
1000.0000 mL | Freq: Once | INTRAVENOUS | Status: AC
  Administered 2022-09-29: 1000 mL via INTRAVENOUS

## 2022-09-29 MED ORDER — ONDANSETRON 4 MG PO TBDP: 4.0000 mg | ORAL_TABLET | Freq: Three times a day (TID) | ORAL | 0 refills | Status: AC | PRN

## 2022-09-29 NOTE — ED Notes (Signed)
Patient sleeping

## 2022-09-29 NOTE — ED Provider Notes (Signed)
[place] EMERGENCY DEPARTMENT AT [facility] Provider Note   CSN: [id number] Arrival date & time: 09/29/22  8295     History  Chief Complaint  Patient presents with   multiple complaints    Helen Taylor is a 27 y.o. female.  Patient with surgical history presents to the emergency department for evaluation of nausea and vomiting.  Patient states that yesterday she developed some URI symptoms including sneezing and congestion.  Earlier this morning around midnight she developed persistent nausea and vomiting.  She could not tolerate any fluids.  She developed a headache.  She reports having history of migraine.  She has taken over-the-counter cough and cold medication.  Denies significant abdominal pain or diarrhea.  Denies urinary symptoms.  No history of abdominal surgeries.  No known sick contacts.  No suspicious food or water exposures.       Home Medications Prior to Admission medications   Medication Sig Start Date End Date Taking? Authorizing Provider  acetaminophen (TYLENOL) 500 MG tablet Take 1,000 mg by mouth every 6 (six) hours as needed for mild pain. Patient not taking: Reported on 11/07/2020    [provider]  ondansetron (ZOFRAN ODT) 4 MG disintegrating tablet Take 1 tablet (4 mg total) by mouth every 8 (eight) hours as needed for nausea or vomiting. Patient not taking: No sig reported 07/22/16   Alvira Monday, MD      Allergies    Patient has no known allergies.    Review of Systems   Review of Systems  Physical Exam Updated Vital Signs BP 126/66 (BP Location: Right Arm)   Pulse 93   Temp 98.7 F (37.1 C)   Resp 18   SpO2 99%  Physical Exam Vitals and nursing note reviewed.  Constitutional:      General: She is in acute distress.     Appearance: She is well-developed.     Comments: Patient tearful  HENT:     Head: Normocephalic and atraumatic.     Jaw: No trismus.     Right Ear: Tympanic membrane, ear canal and external ear  normal.     Left Ear: Tympanic membrane, ear canal and external ear normal.     Nose: Nose normal. No mucosal edema or rhinorrhea.     Mouth/Throat:     Mouth: Mucous membranes are moist. Mucous membranes are not dry. No oral lesions.     Pharynx: Uvula midline. No oropharyngeal exudate, posterior oropharyngeal erythema or uvula swelling.     Tonsils: No tonsillar abscesses.  Eyes:     General:        Right eye: No discharge.        Left eye: No discharge.     Conjunctiva/sclera: Conjunctivae normal.  Cardiovascular:     Rate and Rhythm: Normal rate and regular rhythm.     Heart sounds: Normal heart sounds.  Pulmonary:     Effort: Pulmonary effort is normal. No respiratory distress.     Breath sounds: Normal breath sounds. No wheezing or rales.  Abdominal:     Palpations: Abdomen is soft.     Tenderness: There is no abdominal tenderness. There is no guarding or rebound.     Comments: No focal abdominal tenderness  Musculoskeletal:     Cervical back: Normal range of motion and neck supple.  Lymphadenopathy:     Cervical: No cervical adenopathy.  Skin:    General: Skin is warm and dry.  Neurological:     General: No  focal deficit present.     Mental Status: She is alert and oriented to person, place, and time. Mental status is at baseline.     Cranial Nerves: No cranial nerve deficit.     Sensory: No sensory deficit.     Motor: No weakness.     Gait: Gait normal.  Psychiatric:        Mood and Affect: Mood normal.     ED Results / Procedures / Treatments   Labs (all labs ordered are listed, but only abnormal results are displayed) Labs Reviewed  CBC WITH DIFFERENTIAL/PLATELET - Abnormal; Notable for the following components:      Result Value   Neutro Abs 8.5 (*)    All other components within normal limits  COMPREHENSIVE METABOLIC PANEL - Abnormal; Notable for the following components:   CO2 20 (*)    Glucose, Bld 109 (*)    All other components within normal limits   URINALYSIS, ROUTINE W REFLEX MICROSCOPIC - Abnormal; Notable for the following components:   APPearance CLOUDY (*)    Ketones, ur 80 (*)    Protein, ur 30 (*)    Leukocytes,Ua SMALL (*)    Bacteria, UA RARE (*)    All other components within normal limits  RESP PANEL BY RT-PCR (RSV, FLU A&B, COVID)  RVPGX2  GROUP A STREP BY PCR  LIPASE, BLOOD  PREGNANCY, URINE  HCG, QUANTITATIVE, PREGNANCY  I-STAT BETA HCG BLOOD, ED ([facility], [facility], [facility] ONLY)    EKG None  Radiology No results found.  Procedures Procedures    Medications Ordered in ED Medications  sodium chloride 0.9 % bolus 1,000 mL (0 mLs Intravenous Stopped 09/29/22 1014)  metoCLOPramide (REGLAN) injection 10 mg (10 mg Intravenous Given 09/29/22 0839)  diphenhydrAMINE (BENADRYL) injection 25 mg (25 mg Intravenous Given 09/29/22 0839)    ED Course/ Medical Decision Making/ A&P Clinical Course as of 09/29/22 1415  Mon Sep 29, 2022  1156 hCG, quantitative, pregnancy [SK]    Clinical Course User Index [SK] Sherlyn Lick, Student-PA    Patient seen and examined. History obtained directly from patient. Work-up including labs, imaging, EKG ordered in triage, if performed, were reviewed.    Labs/EKG: Independently reviewed and interpreted.  This included: Flu, COVID, RSV and strep negative  I have added CBC, CMP, lipase, UA, pregnancy given patient's reported vomiting.  Imaging: None ordered  Medications/Fluids: Ordered: IV fluid bolus, IV Reglan, IV Benadryl  Most recent vital signs reviewed and are as follows: BP 126/66 (BP Location: Right Arm)   Pulse 93   Temp 98.7 F (37.1 C)   Resp 18   SpO2 99%   Initial impression: Headache without red flags, nausea/vomiting, URI symptoms  2:15 PM Reassessment performed. Patient appears stable.  There was a delay in obtaining the pregnancy test.    Labs personally reviewed and interpreted including: Pregnancy was negative. CBC with differential with borderline high white  blood cell count and hemoglobin was unremarkable; CMP unremarkable; lipase normal; UA not a clean-catch but 80 ketones suggesting dehydration; pregnancy negative.  Strep and viral panel negative.  Reviewed pertinent lab work and imaging with patient at bedside. Questions answered.   Most current vital signs reviewed and are as follows: BP 111/62   Pulse 81   Temp 98 F (36.7 C)   Resp 17   SpO2 99%   Plan: Discharge to home.   Prescriptions written for: Zofran  Other home care instructions discussed: Rest, hydration, OTC meds  ED return instructions  discussed: New or worsening symptoms, persistent vomiting, severe abdominal pain  Follow-up instructions discussed: Patient encouraged to follow-up with their PCP in 3-5 days if not improved.                             Medical Decision Making Amount and/or Complexity of Data Reviewed Labs: ordered.  Risk Prescription drug management.   In regards to the patient's headache, critical differentials were considered including subarachnoid hemorrhage, intracerebral hemorrhage, epidural/subdural hematoma, pituitary apoplexy, vertebral/carotid artery dissection, giant cell arteritis, central venous thrombosis, reversible cerebral vasoconstriction, acute angle closure glaucoma, idiopathic intracranial hypertension, bacterial meningitis, viral encephalitis, carbon monoxide poisoning, posterior reversible encephalopathy syndrome, pre-eclampsia.   Reg flag symptoms related to these causes were considered including systemic symptoms (fever, weight loss), neurologic symptoms (confusion, mental status change, vision change, associated seizure), acute or sudden "thunderclap" onset, patient age 49 or older with new or progressive headache, patient of any age with first headache or change in headache pattern, pregnant or postpartum status, history of HIV or other immunocompromise, history of cancer, headache occurring with exertion, associated neck or  shoulder pain, associated traumatic injury, concurrent use of anticoagulation, family history of spontaneous SAH, and concurrent drug use.    Other benign, more common causes of headache were considered including migraine, tension-type headache, cluster headache, referred pain from other cause such as sinus infection, dental pain, trigeminal neuralgia.   On exam, patient has a reassuring neuro exam including baseline mental status, no significant neck pain or meningeal signs, no signs of severe infection or fever.   The patient's vital signs, pertinent lab work and imaging were reviewed and interpreted as discussed in the ED course. Hospitalization was considered for further testing, treatments, or serial exams/observation. However as patient is well-appearing, has a stable exam over the course of their evaluation, and reassuring studies today, I do not feel that they warrant admission at this time. This plan was discussed with the patient who verbalizes agreement and comfort with this plan and seems reliable and able to return to the Emergency Department with worsening or changing symptoms.          Final Clinical Impression(s) / ED Diagnoses Final diagnoses:  Acute nonintractable headache, unspecified headache type  Viral syndrome    Rx / DC Orders ED Discharge Orders          Ordered    ondansetron (ZOFRAN-ODT) 4 MG disintegrating tablet  Every 8 hours PRN        09/29/22 1409              Renne Crigler, PA-C 09/29/22 1501    Mardene Sayer, MD 09/29/22 Rickey Primus

## 2022-09-29 NOTE — Discharge Instructions (Addendum)
Please read and follow all provided instructions.  Your diagnoses today include:  1. Acute nonintractable headache, unspecified headache type   2. Viral syndrome     You appear to have an upper respiratory infection (URI). An upper respiratory tract infection, or cold, is a viral infection of the air passages leading to the lungs. It should improve gradually after 5-7 days. You may have a lingering cough that lasts for 2- 4 weeks after the infection.  Tests performed today include: Vital signs. See below for your results today.  Flu, COVID, RSV, strep: Was negative Pregnancy test: Was negative  blood cell counts and electrolytes: No problems seen  Medications prescribed:  Zofran (ondansetron) - for nausea and vomiting  Take any prescribed medications only as directed. Treatment for your infection is aimed at treating the symptoms. There are no medications, such as antibiotics, that will cure your infection.   Home care instructions:  You can take Tylenol and/or Ibuprofen as directed on the packaging for fever reduction and pain relief.    For cough: honey 1/2 to 1 teaspoon (you can dilute the honey in water or another fluid).  You can also use guaifenesin and dextromethorphan for cough. You can use a humidifier for chest congestion and cough.  If you don't have a humidifier, you can sit in the bathroom with the hot shower running.      For sore throat: try warm salt water gargles, cepacol lozenges, throat spray, warm tea or water with lemon/honey, popsicles or ice, or OTC cold relief medicine for throat discomfort.    For congestion: take a daily anti-histamine like Zyrtec, Claritin, and a oral decongestant, such as pseudoephedrine.  You can also use Flonase 1-2 sprays in each nostril daily.    It is important to stay hydrated: drink plenty of fluids (water, gatorade/powerade/pedialyte, juices, or teas) to keep your throat moisturized and help further relieve irritation/discomfort.    Your illness is contagious and can be spread to others, especially during the first 3 or 4 days. It cannot be cured by antibiotics or other medicines. Take basic precautions such as washing your hands often, covering your mouth when you cough or sneeze, and avoiding public places where you could spread your illness to others.   Please continue drinking plenty of fluids.  Use over-the-counter medicines as needed as directed on packaging for symptom relief.  You may also use ibuprofen or tylenol as directed on packaging for pain or fever.  Do not take multiple medicines containing Tylenol or acetaminophen to avoid taking too much of this medication.  Follow-up instructions: Please follow-up with your primary care provider in the next 3 days for further evaluation of your symptoms if you are not feeling better.   Return instructions:  Please return to the Emergency Department if you experience worsening symptoms.  RETURN IMMEDIATELY IF you develop shortness of breath, confusion or altered mental status, a new rash, become dizzy, faint, or poorly responsive, or are unable to be cared for at home. Please return if you have persistent vomiting and cannot keep down fluids or develop a fever that is not controlled by tylenol or motrin.   Please return if you have any other emergent concerns.  Additional Information:  Your vital signs today were: BP 111/62   Pulse 81   Temp 98 F (36.7 C)   Resp 17   SpO2 99%  If your blood pressure (BP) was elevated above 135/85 this visit, please have this repeated by your doctor  within one month. --------------

## 2022-09-29 NOTE — ED Triage Notes (Signed)
Pt arrived via POV w c/o waking up today with sore throat, stuffy nose, mild chest congestion. Pt took some otc meds and now feels very hot. Pt took ACTM, benadryl and xl-3 cold and congestion medicine. Pt states that she just does not feel good.

## 2023-02-05 IMAGING — US US ART/VEN ABD/PELV/SCROTUM DOPPLER LTD
2 series · 14 of 25 positions shown · non-contrast
Comparison: None

CLINICAL DATA: Left lower quadrant pain and vaginal bleeding.



[Series 1: us pelvic complete with transvaginal · 11 of 128 slices shown (1 of 2)]
[im 1/128]
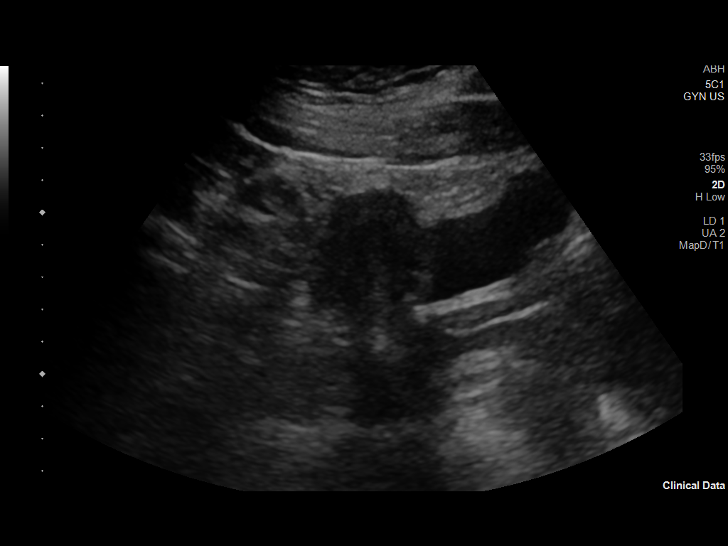
[im 15/128]
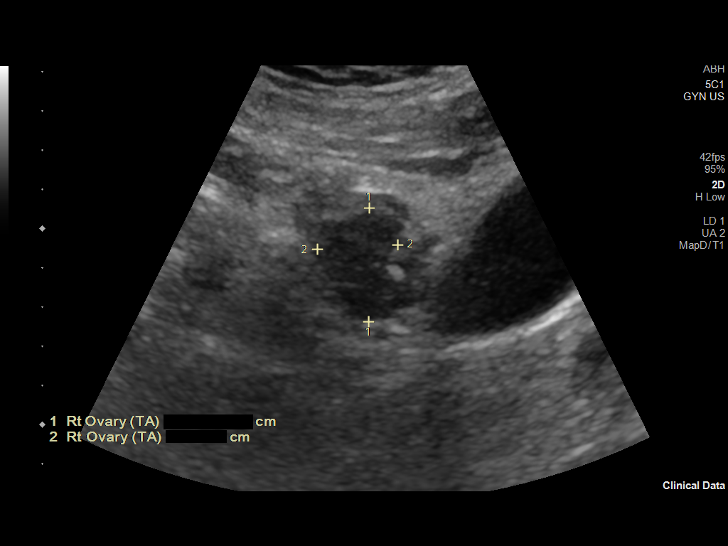
[im 29/128]
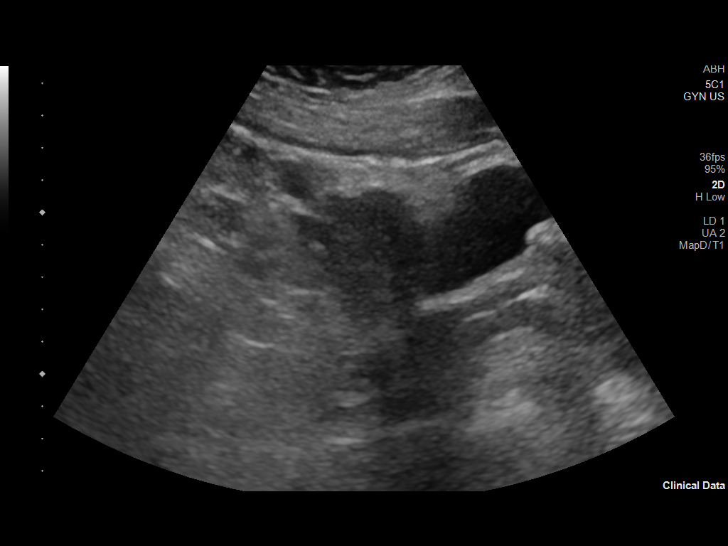
[im 43/128]
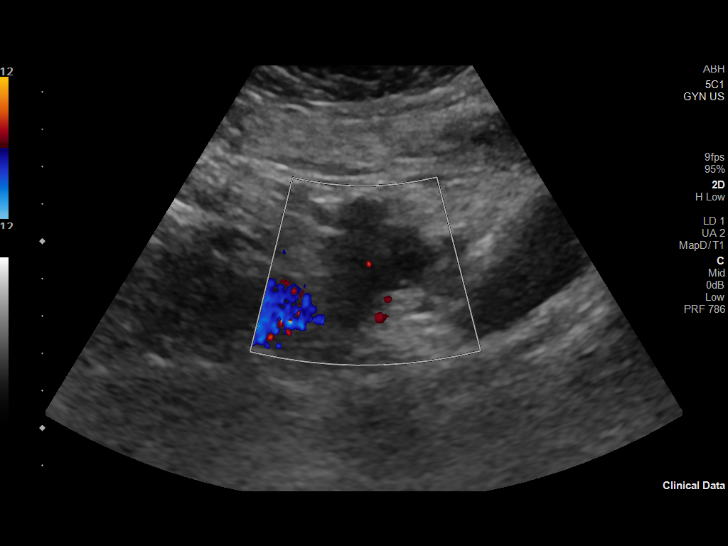
[im 57/128]
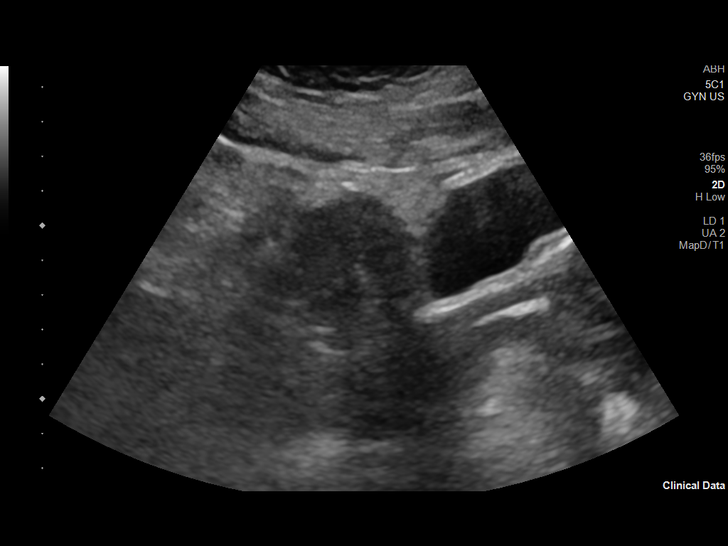
[im 64/128]
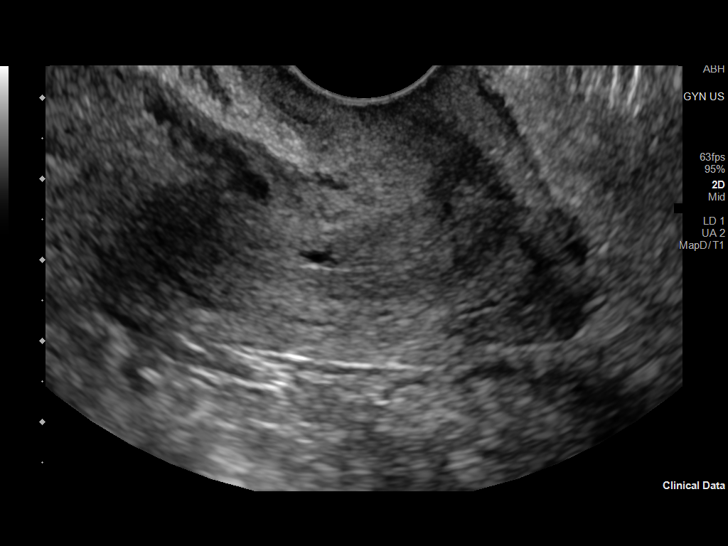
[im 78/128]
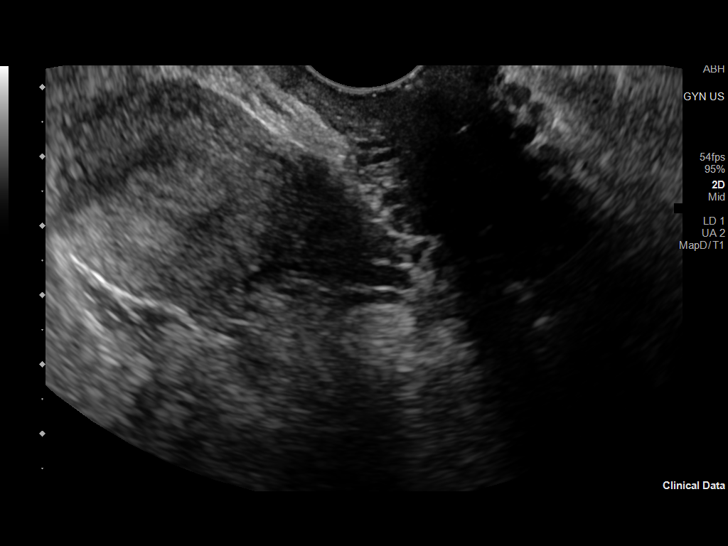
[im 92/128]
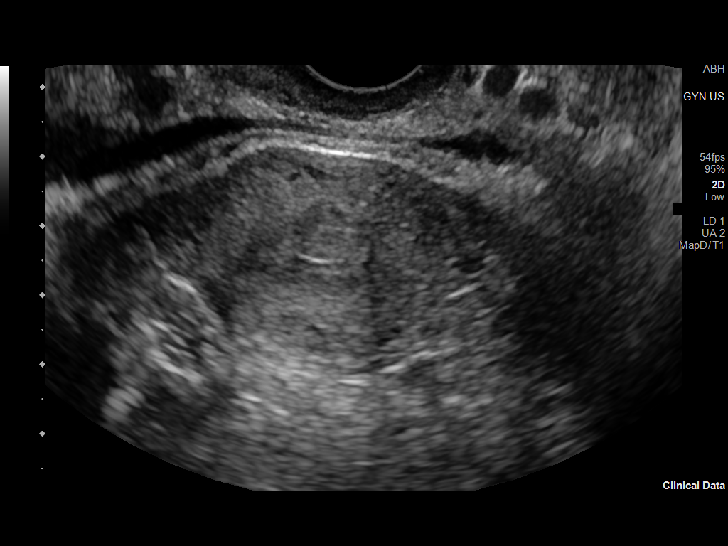
[im 106/128]
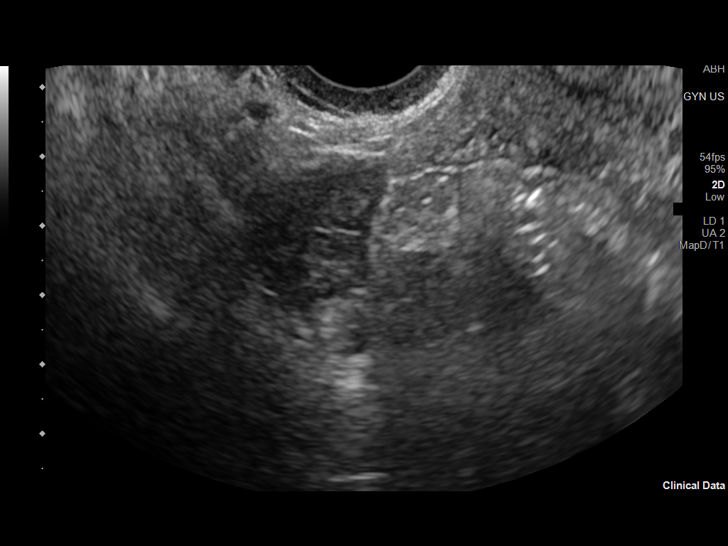
[im 113/128]
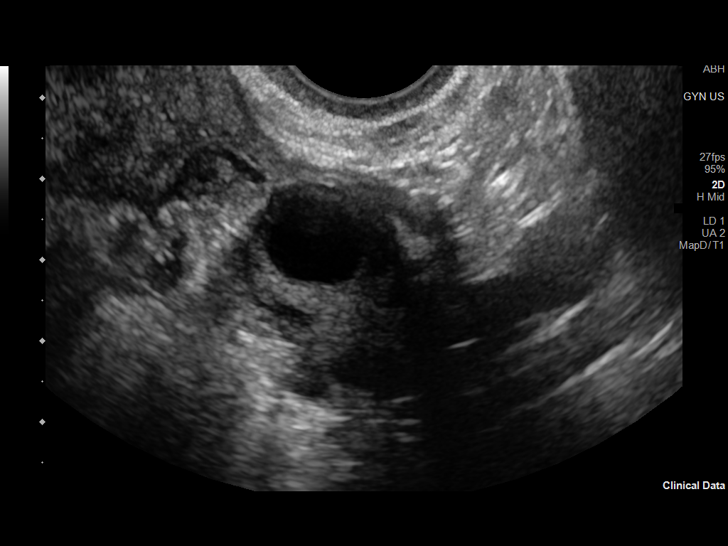
[im 128/128]
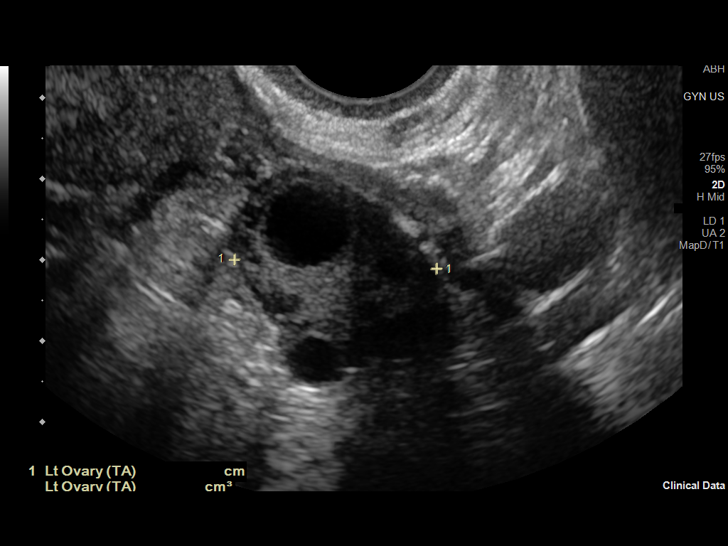

[Series 1001: us pelvic complete with transvaginal · 3 of 40 slices shown (2 of 2)]
[im 8/40]
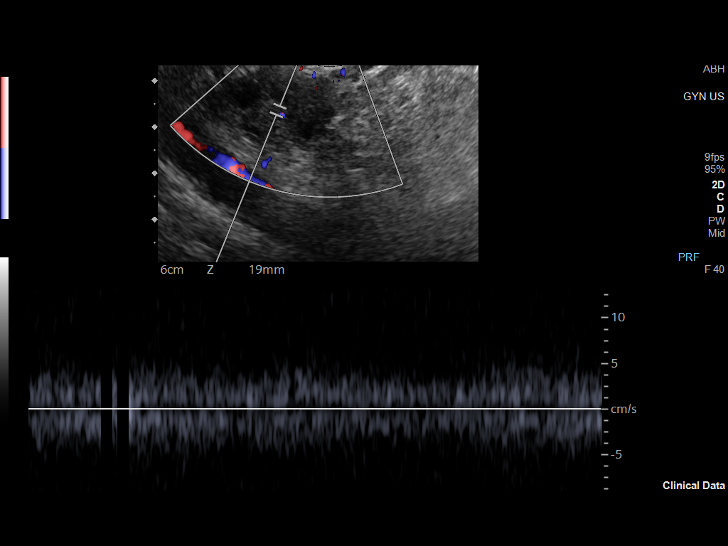
[im 24/40]
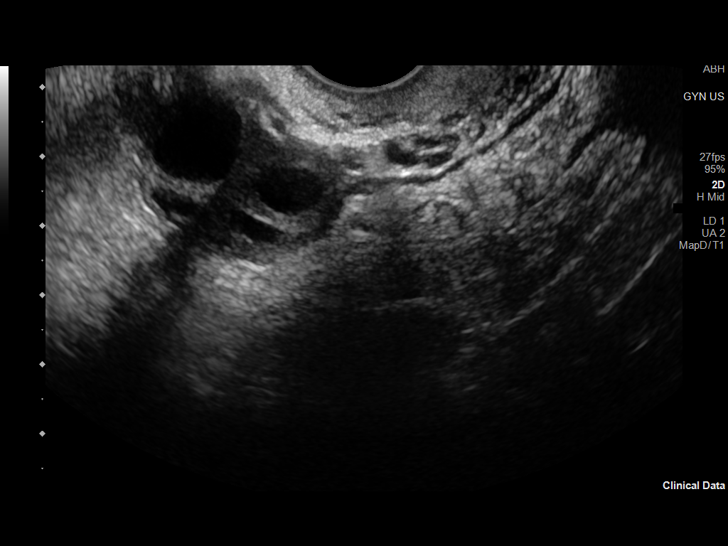
[im 40/40]
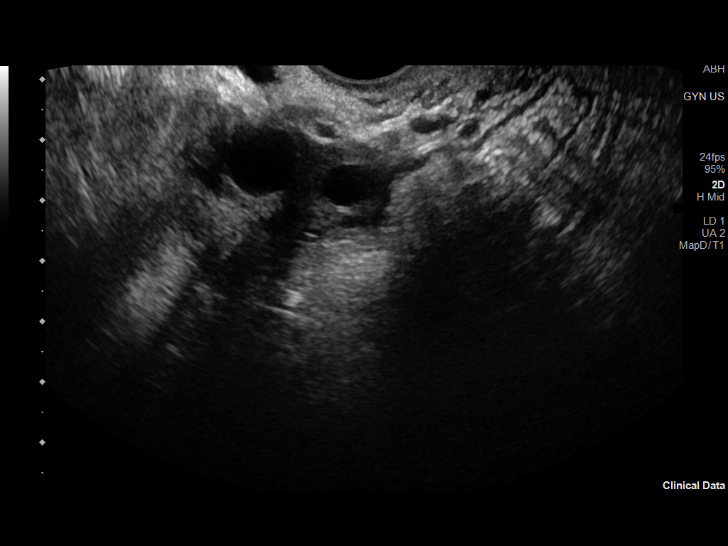

[14 of 25 positions shown; findings below may reference images not displayed]

FINDINGS: Uterus

Measurements: 7.8 x 3.3 x 4.0 cm = volume: 54 mL. No fibroids or
other mass visualized.

Endometrium

Thickness: 6 mm.  No focal abnormality visualized.

Right ovary

Measurements: 3.3 x 2.1 x 1.8 cm = volume: 7 mL. Normal
appearance/no adnexal mass.

Left ovary

Measurements: 2.8 x 2.0 x 2.5 cm = volume: 7 mL. Dominant follicle
noted. No evidence for adnexal mass.

Other findings

No abnormal free fluid.
IMPRESSION: Unremarkable exam. No findings to explain the patient's history of
left lower quadrant pain.

## 2023-02-05 IMAGING — US US PELVIS COMPLETE WITH TRANSVAGINAL
2 series · 13 of 25 positions shown · non-contrast
Comparison: None
COMPARISON: None

Addendum:
CLINICAL DATA: Left lower quadrant pain and vaginal bleeding.



[Series 1: us pelvic complete with transvaginal · 10 of 128 slices shown (1 of 2)]
[im 1/128]
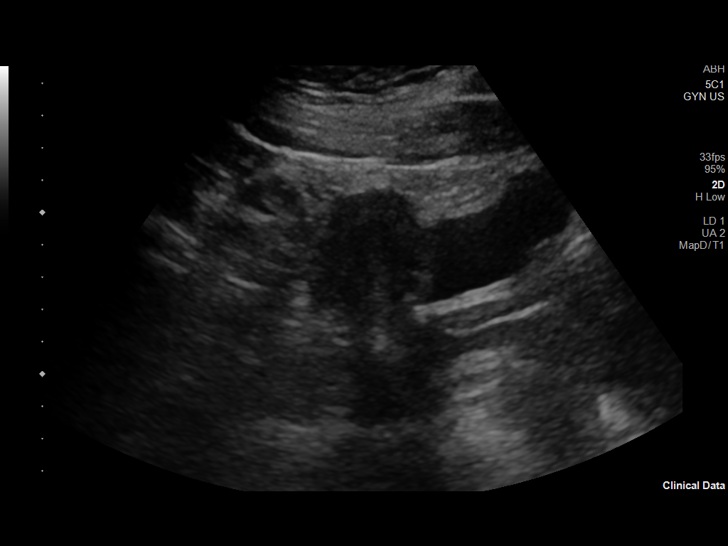
[im 15/128]
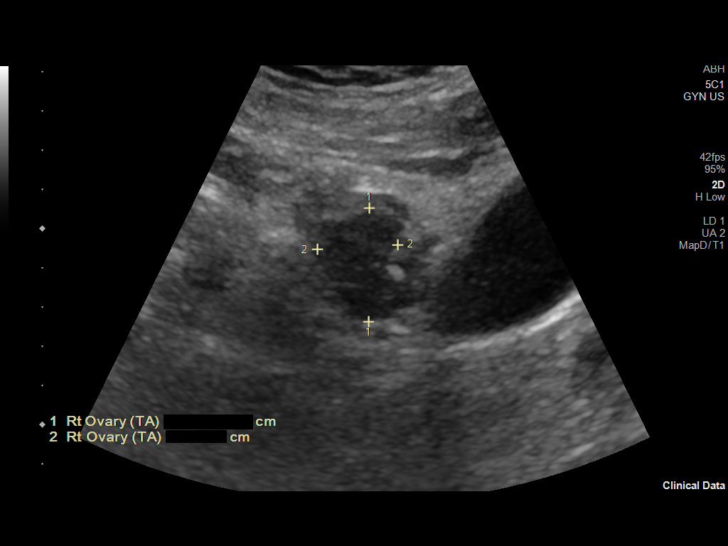
[im 29/128]
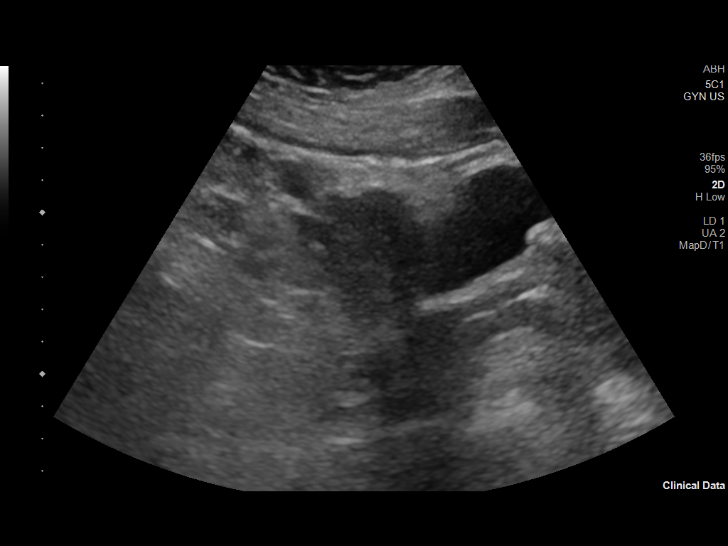
[im 43/128]
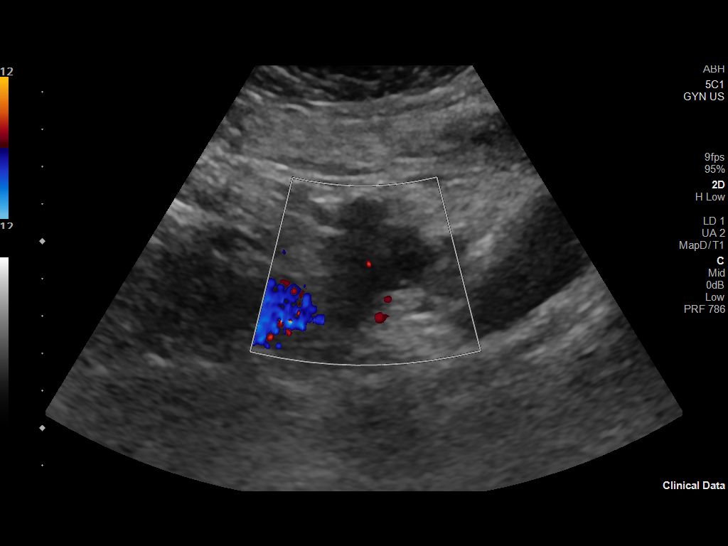
[im 57/128]
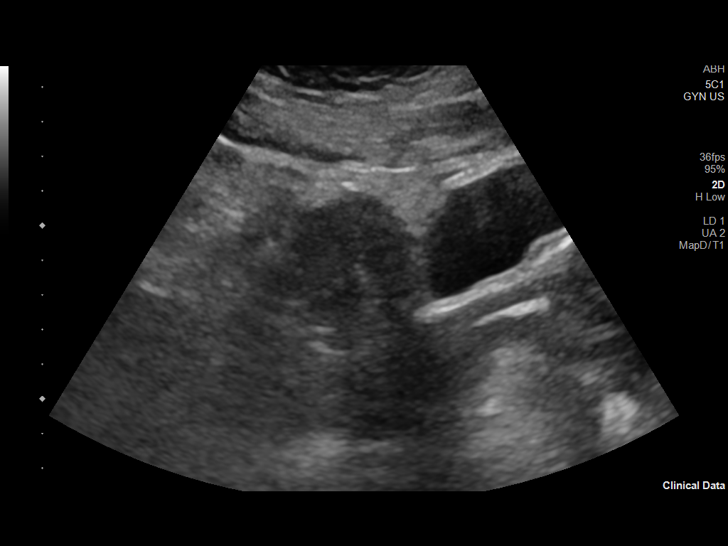
[im 71/128]
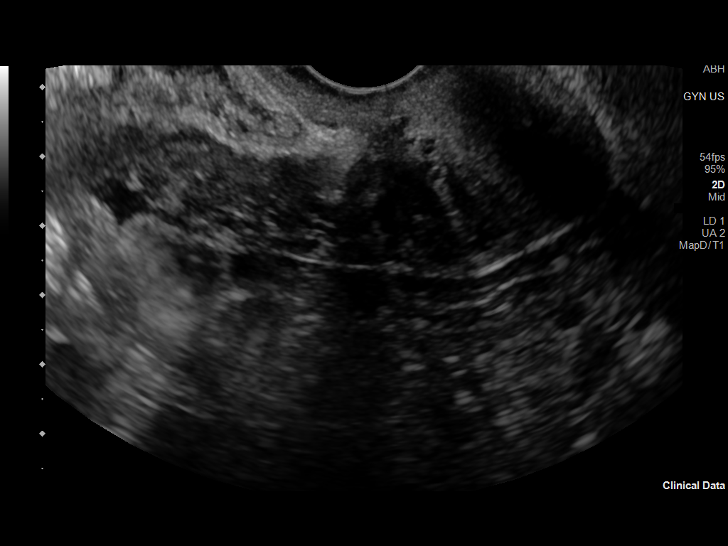
[im 85/128]
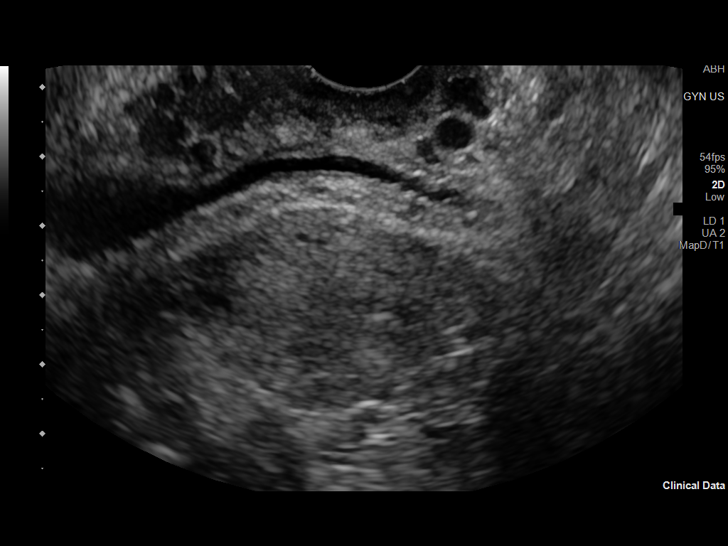
[im 99/128]
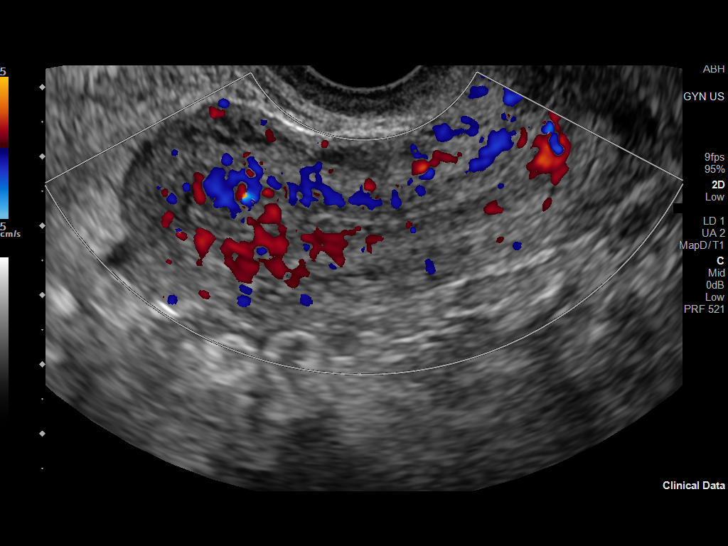
[im 113/128]
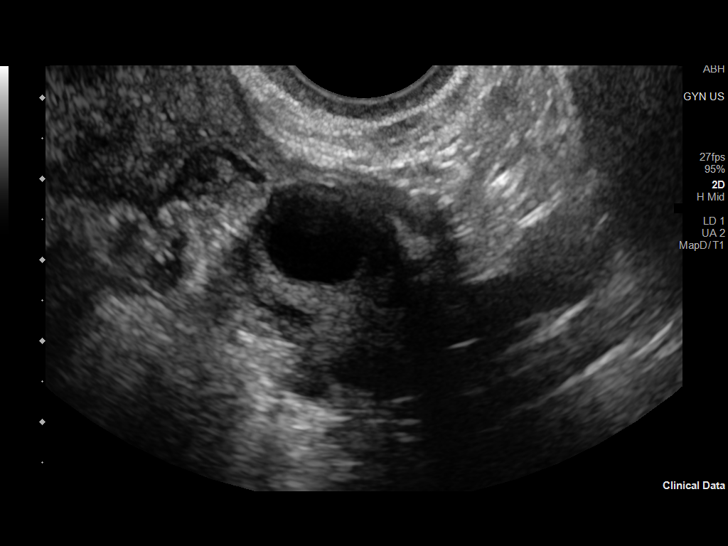
[im 128/128]
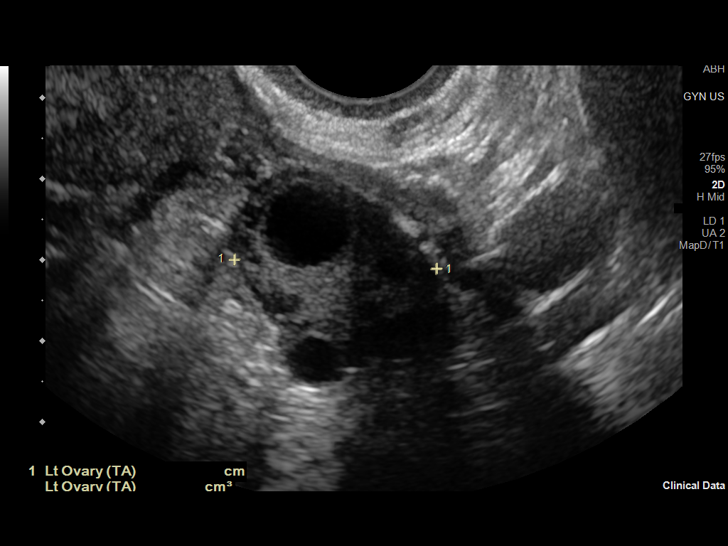

[Series 1001: us pelvic complete with transvaginal · 3 of 40 slices shown (2 of 2)]
[im 8/40]
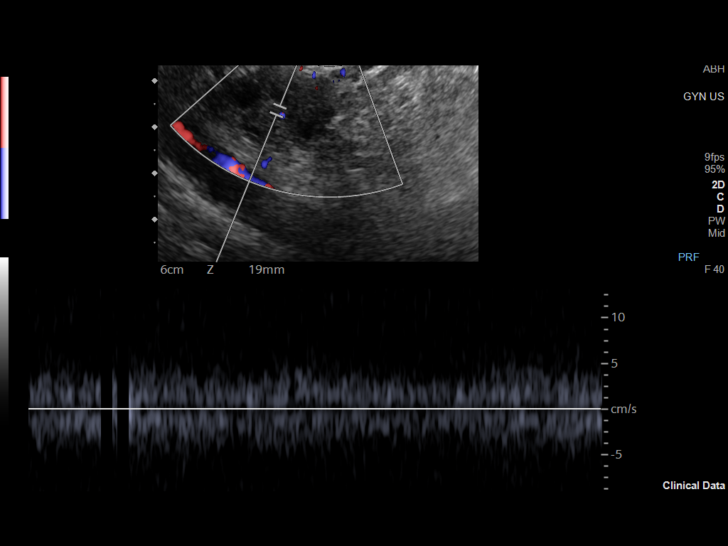
[im 24/40]
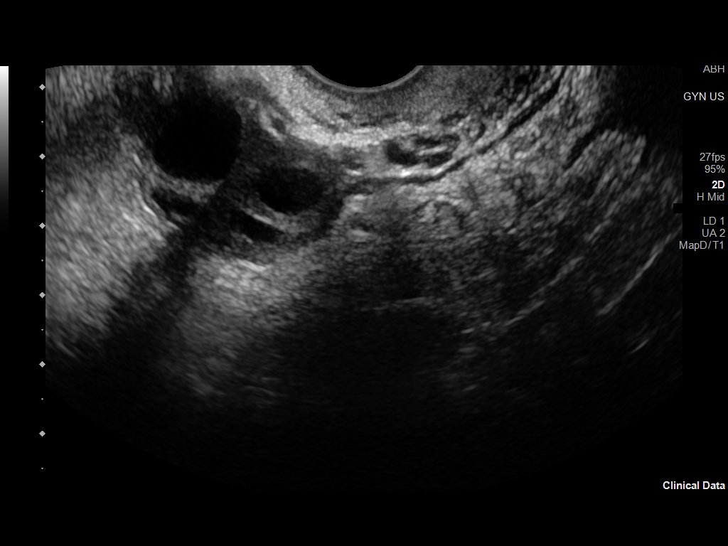
[im 40/40]
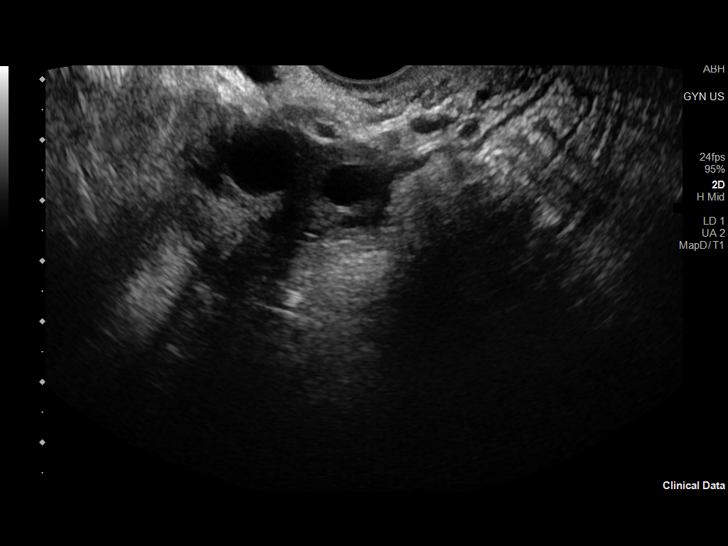

[13 of 25 positions shown; findings below may reference images not displayed]

FINDINGS: Uterus

Measurements: 7.8 x 3.3 x 4.0 cm = volume: 54 mL. No fibroids or
other mass visualized.

Endometrium

Thickness: 6 mm.  No focal abnormality visualized.

Right ovary

Measurements: 3.3 x 2.1 x 1.8 cm = volume: 7 mL. Normal
appearance/no adnexal mass.

Left ovary

Measurements: 2.8 x 2.0 x 2.5 cm = volume: 7 mL. Dominant follicle
noted. No evidence for adnexal mass.

Other findings

No abnormal free fluid.
IMPRESSION: Unremarkable exam. No findings to explain the patient's history of
left lower quadrant pain.

ADDENDUM:
Additional provided history demonstrates that patient is now noted
to have a positive pregnancy test. Patient was brought back to
ultrasound for Doppler assessment of the ovaries and this additional
imaging demonstrates low resistance arterial and venous flow in both
ovaries.

Thorough evaluation of the adnexal regions demonstrates no evidence
for adnexal mass to suggest ectopic gestation. There is no evidence
for hemoperitoneum.

With the provided history of positive pregnancy test, differential
considerations for this study now include intrauterine gestation too
early to visualize, completed abortion, or nonvisualized ectopic
pregnancy.

*** End of Addendum ***
FINDINGS: Uterus

Measurements: 7.8 x 3.3 x 4.0 cm = volume: 54 mL. No fibroids or
other mass visualized.

Endometrium

Thickness: 6 mm.  No focal abnormality visualized.

Right ovary

Measurements: 3.3 x 2.1 x 1.8 cm = volume: 7 mL. Normal
appearance/no adnexal mass.

Left ovary

Measurements: 2.8 x 2.0 x 2.5 cm = volume: 7 mL. Dominant follicle
noted. No evidence for adnexal mass.

Other findings

No abnormal free fluid.
IMPRESSION: Unremarkable exam. No findings to explain the patient's history of
left lower quadrant pain.

## 2023-02-12 IMAGING — US US OB TRANSVAGINAL
1 series · 15 of 28 positions shown · non-contrast
Comparison: 10/23/2020 and 10/18/2020

CLINICAL DATA: 24-year-old with positive pregnancy test and left
lower quadrant pain. Vaginal bleeding.

EXAM:
TRANSVAGINAL OB ULTRASOUND
TECHNIQUE: Transvaginal ultrasound was performed for complete evaluation of the
gestation as well as the maternal uterus, adnexal regions, and
pelvic cul-de-sac.

[Series 1: us ob transvaginal · 15 of 32 slices shown]
[im 1/32]
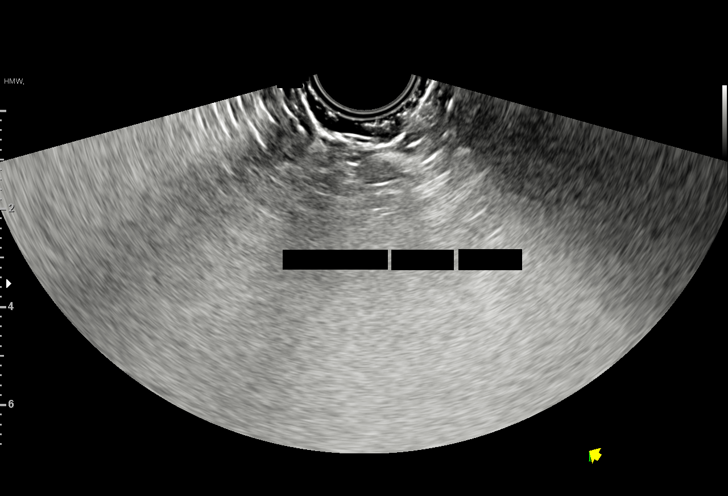
[im 3/32]
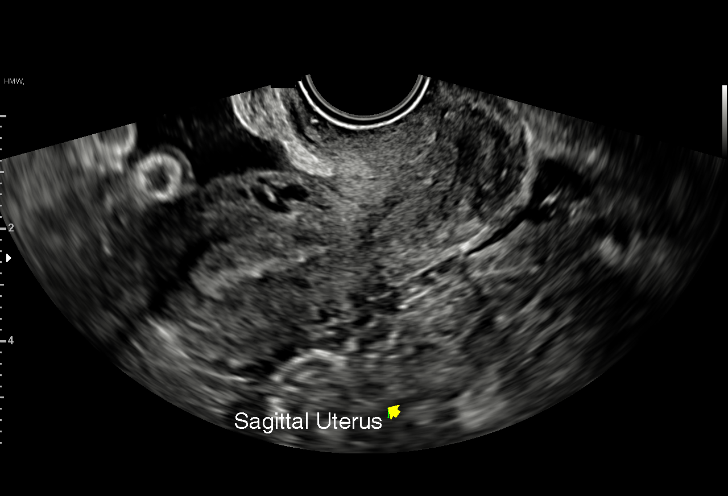
[im 5/32]
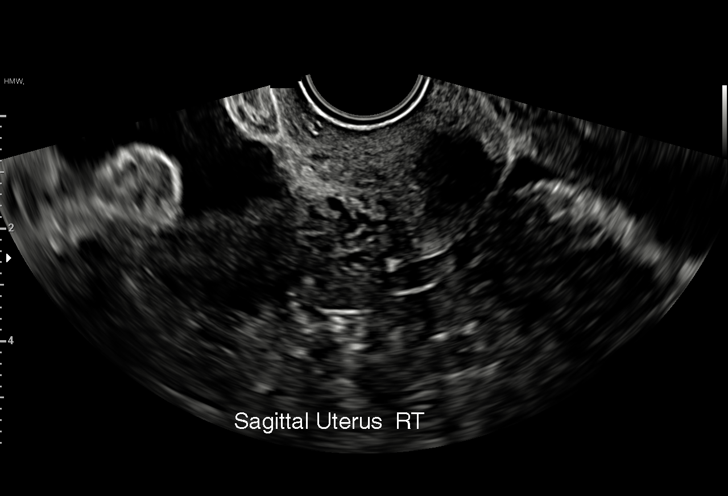
[im 7/32]
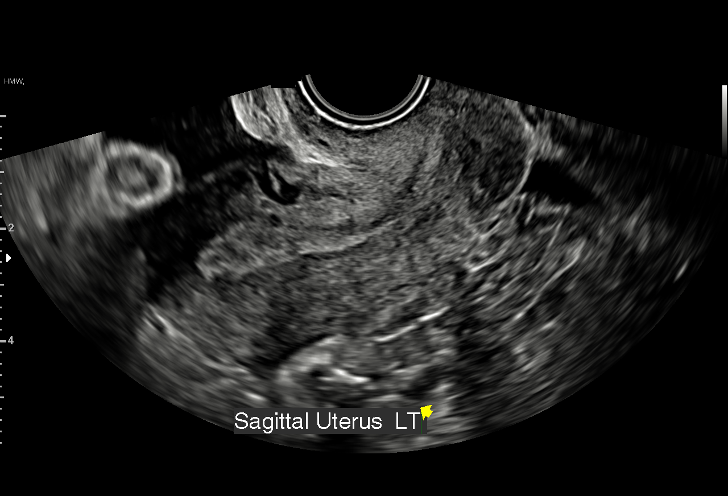
[im 10/32]
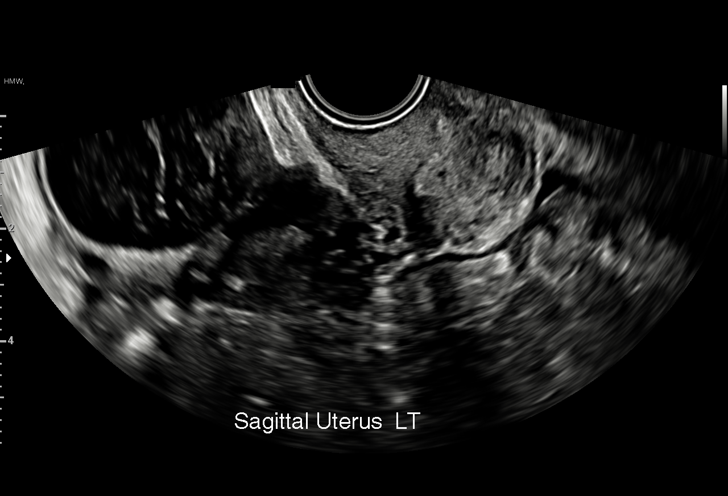
[im 12/32]
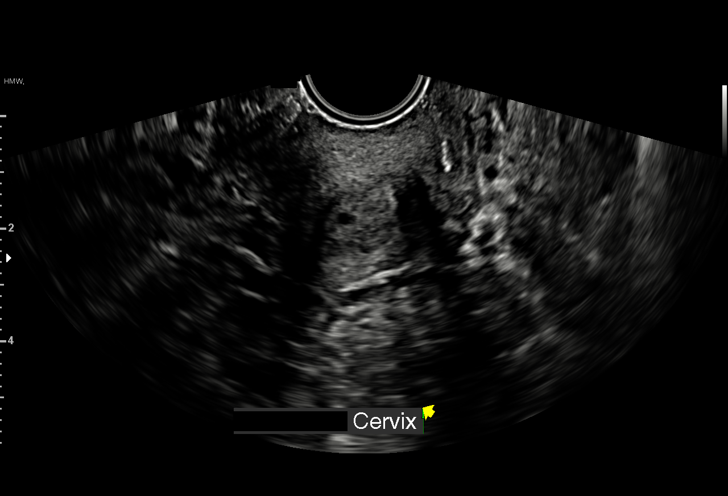
[im 14/32]
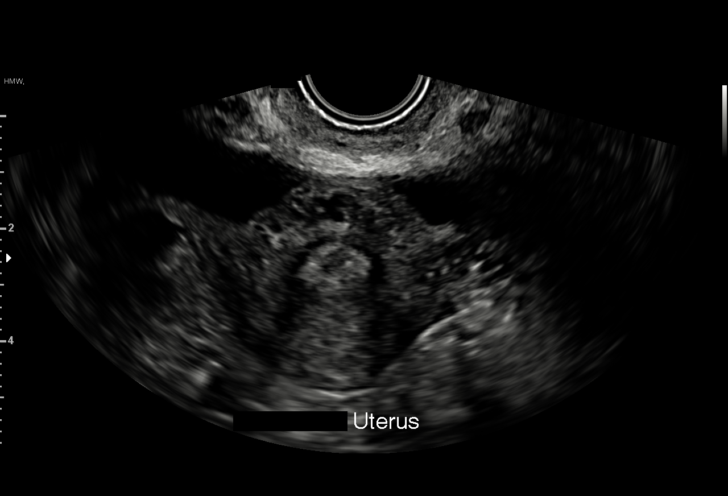
[im 17/32]
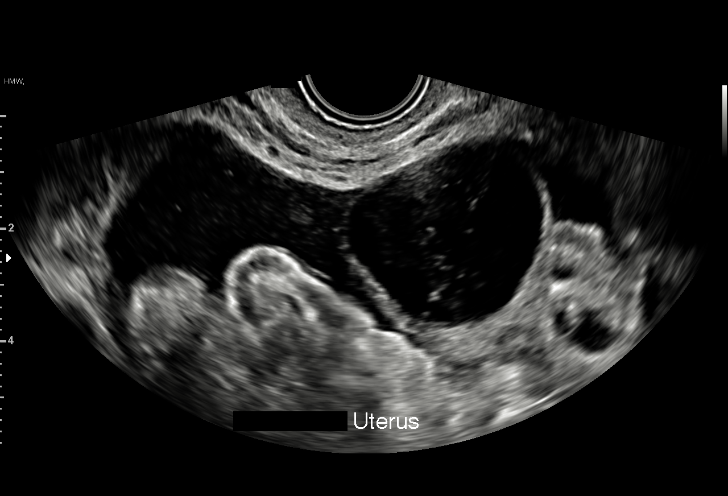
[im 18/32]
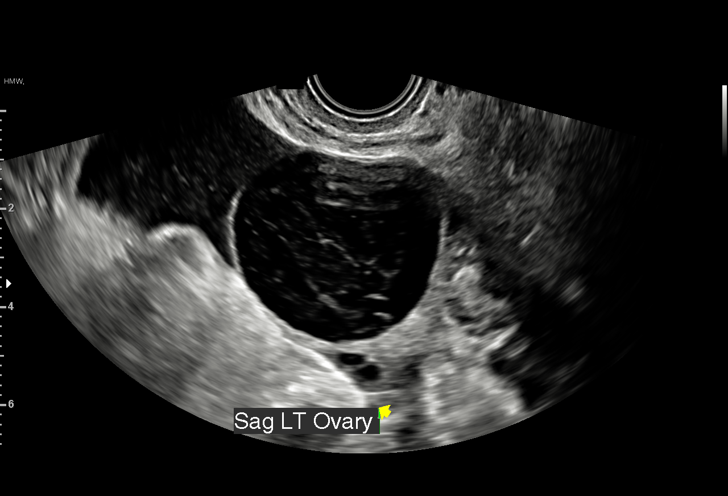
[im 20/32]
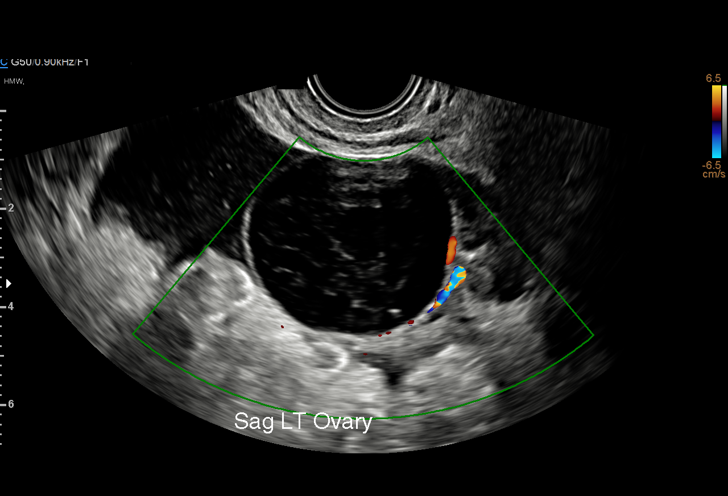
[im 22/32]
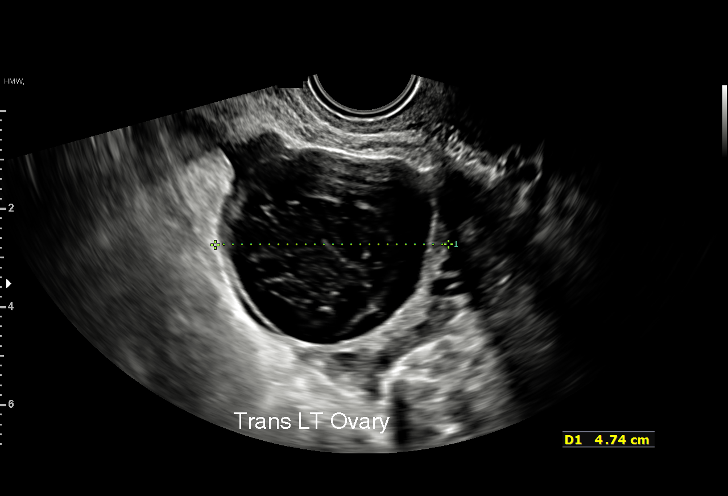
[im 25/32]
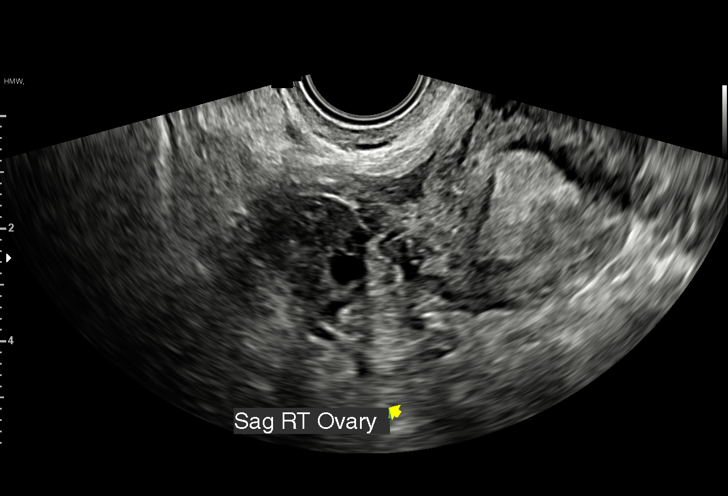
[im 27/32]
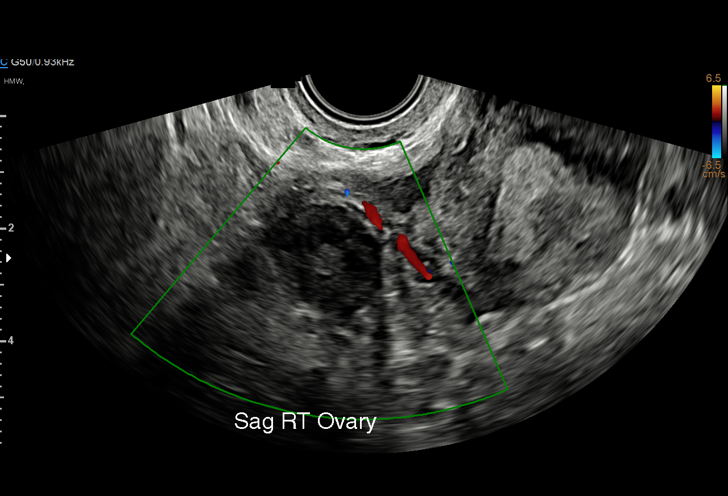
[im 29/32]
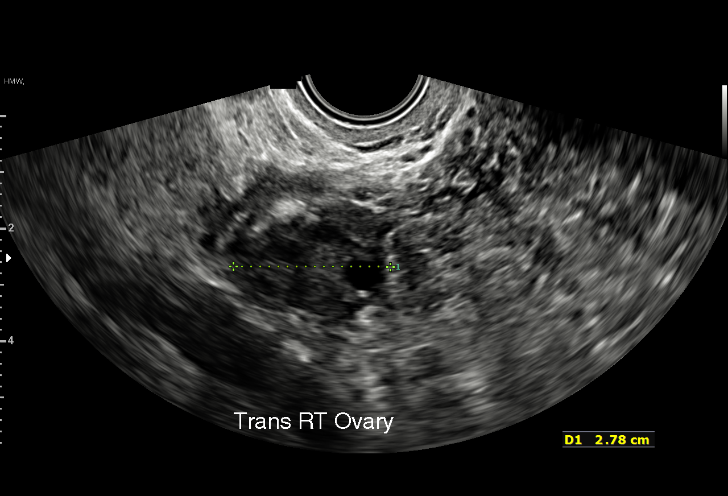
[im 32/32]
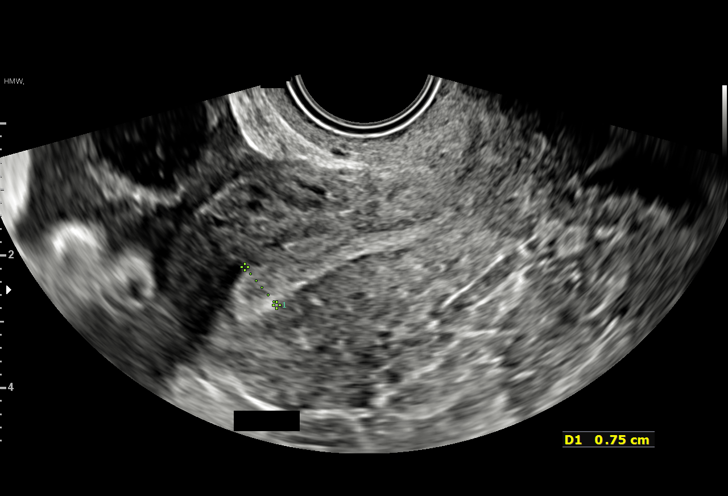

[15 of 28 positions shown; findings below may reference images not displayed]

FINDINGS: Intrauterine gestational sac: None

Yolk sac:  None

Embryo:  None

Maternal uterus/adnexae: Left ovary measures 5.4 x 4.6 x 4.7 cm.
There is now a complex cystic structure associated with left ovary.
This complex cystic structure measures 4.1 x 4.3 x 4.3 cm and
measured 2.7 x 2.4 x 2.8 cm on 10/23/2020. Previously, the left
ovarian cyst appeared more simple. In addition, there appears to be
a small amount of complex free fluid in the pelvis which is new.
Normal appearance of the right ovary measuring 2.2 x 1.8 x 2.8 cm.
Endometrium measures 8 mm. No gross abnormality to the uterus.
IMPRESSION: 1. Enlarging left ovarian cyst. This cyst is now complex and
suggestive for a hemorrhagic cyst. In addition, there is complex
fluid in the pelvis which could represent blood. If the patient did
not have a pregnancy test, the findings would be suggestive for a
ruptured hemorrhagic ovarian cyst. However, the patient has a
positive pregnancy test and findings raise concern for an ectopic
pregnancy. No evidence for an intrauterine pregnancy and an ectopic
pregnancy is not clearly identified. It is possible the patient has
a ruptured hemorrhagic cyst with an early intrauterine pregnancy
that is not visualized. Recommend correlation with hCG levels.
2. These results were called by telephone at the time of
interpretation on 10/25/2020 at [DATE] to provider JULLON LIENAD ,
who verbally acknowledged these results.
